# Patient Record
Sex: Female | Born: 1940 | Race: White | Hispanic: No | Marital: Married | Smoking: Never smoker
Health system: Southern US, Community
[De-identification: ages and names within clinical notes are randomized; demographics above are authoritative.]

## PROBLEM LIST (undated history)

## (undated) DIAGNOSIS — C801 Malignant (primary) neoplasm, unspecified: Secondary | ICD-10-CM

## (undated) HISTORY — PX: PARTIAL HYSTERECTOMY: SHX80

## (undated) HISTORY — PX: BREAST BIOPSY: SHX20

---

## 2004-05-25 ENCOUNTER — Ambulatory Visit: Payer: Self-pay | Admitting: Internal Medicine

## 2005-07-10 ENCOUNTER — Ambulatory Visit: Payer: Self-pay | Admitting: Internal Medicine

## 2006-01-03 ENCOUNTER — Ambulatory Visit: Payer: Self-pay

## 2006-04-12 ENCOUNTER — Emergency Department: Payer: Self-pay | Admitting: Emergency Medicine

## 2006-04-13 ENCOUNTER — Other Ambulatory Visit: Payer: Self-pay

## 2006-08-27 ENCOUNTER — Ambulatory Visit: Payer: Self-pay | Admitting: Internal Medicine

## 2007-09-08 ENCOUNTER — Ambulatory Visit: Payer: Self-pay | Admitting: Internal Medicine

## 2008-11-02 ENCOUNTER — Ambulatory Visit: Payer: Self-pay | Admitting: Internal Medicine

## 2008-12-02 ENCOUNTER — Ambulatory Visit: Payer: Self-pay | Admitting: Rheumatology

## 2009-11-09 ENCOUNTER — Ambulatory Visit: Payer: Self-pay | Admitting: Internal Medicine

## 2010-11-29 ENCOUNTER — Ambulatory Visit: Payer: Self-pay | Admitting: Internal Medicine

## 2011-04-23 ENCOUNTER — Ambulatory Visit: Payer: Self-pay | Admitting: Gastroenterology

## 2011-04-25 LAB — PATHOLOGY REPORT

## 2011-12-04 ENCOUNTER — Ambulatory Visit: Payer: Self-pay | Admitting: Internal Medicine

## 2012-06-18 ENCOUNTER — Ambulatory Visit: Payer: Self-pay | Admitting: Internal Medicine

## 2012-12-10 ENCOUNTER — Ambulatory Visit: Payer: Self-pay | Admitting: Internal Medicine

## 2013-02-18 ENCOUNTER — Ambulatory Visit: Payer: Self-pay | Admitting: Internal Medicine

## 2013-04-07 ENCOUNTER — Ambulatory Visit: Payer: Self-pay | Admitting: Internal Medicine

## 2013-04-10 ENCOUNTER — Ambulatory Visit: Payer: Self-pay | Admitting: Internal Medicine

## 2013-10-23 ENCOUNTER — Ambulatory Visit: Payer: Self-pay | Admitting: Internal Medicine

## 2013-12-01 ENCOUNTER — Emergency Department: Payer: Self-pay | Admitting: Internal Medicine

## 2013-12-01 LAB — COMPREHENSIVE METABOLIC PANEL
Albumin: 2.9 g/dL — ABNORMAL LOW (ref 3.4–5.0)
Alkaline Phosphatase: 44 U/L — ABNORMAL LOW
Anion Gap: 9 (ref 7–16)
BUN: 23 mg/dL — ABNORMAL HIGH (ref 7–18)
Bilirubin,Total: 0.4 mg/dL (ref 0.2–1.0)
Calcium, Total: 8.3 mg/dL — ABNORMAL LOW (ref 8.5–10.1)
Chloride: 103 mmol/L (ref 98–107)
Co2: 24 mmol/L (ref 21–32)
Creatinine: 1.3 mg/dL (ref 0.60–1.30)
EGFR (African American): 48 — ABNORMAL LOW
EGFR (Non-African Amer.): 41 — ABNORMAL LOW
Glucose: 181 mg/dL — ABNORMAL HIGH (ref 65–99)
Osmolality: 280 (ref 275–301)
Potassium: 4.2 mmol/L (ref 3.5–5.1)
SGOT(AST): 20 U/L (ref 15–37)
SGPT (ALT): 14 U/L
Sodium: 136 mmol/L (ref 136–145)
Total Protein: 6.5 g/dL (ref 6.4–8.2)

## 2013-12-01 LAB — CBC
HCT: 41.5 % (ref 35.0–47.0)
HGB: 13.6 g/dL (ref 12.0–16.0)
MCH: 30 pg (ref 26.0–34.0)
MCHC: 32.9 g/dL (ref 32.0–36.0)
MCV: 91 fL (ref 80–100)
Platelet: 342 10*3/uL (ref 150–440)
RBC: 4.55 10*6/uL (ref 3.80–5.20)
RDW: 13.9 % (ref 11.5–14.5)
WBC: 15.1 10*3/uL — ABNORMAL HIGH (ref 3.6–11.0)

## 2013-12-01 LAB — TROPONIN I: Troponin-I: <0.02 ng/mL

## 2014-05-06 ENCOUNTER — Ambulatory Visit: Payer: Self-pay | Admitting: Unknown Physician Specialty

## 2014-11-29 ENCOUNTER — Other Ambulatory Visit: Payer: Self-pay | Admitting: Internal Medicine

## 2014-11-29 DIAGNOSIS — Z1231 Encounter for screening mammogram for malignant neoplasm of breast: Secondary | ICD-10-CM

## 2014-12-09 ENCOUNTER — Other Ambulatory Visit: Payer: Self-pay | Admitting: Internal Medicine

## 2014-12-09 ENCOUNTER — Ambulatory Visit
Admission: RE | Admit: 2014-12-09 | Discharge: 2014-12-09 | Disposition: A | Discharge status: Home / Self Care | Payer: Medicare Other | Source: Ambulatory Visit | Attending: Internal Medicine | Admitting: Internal Medicine

## 2014-12-09 DIAGNOSIS — Z1231 Encounter for screening mammogram for malignant neoplasm of breast: Secondary | ICD-10-CM | POA: Insufficient documentation

## 2014-12-09 HISTORY — DX: Malignant (primary) neoplasm, unspecified: C80.1

## 2014-12-13 ENCOUNTER — Other Ambulatory Visit: Payer: Self-pay | Admitting: Internal Medicine

## 2014-12-13 DIAGNOSIS — R928 Other abnormal and inconclusive findings on diagnostic imaging of breast: Secondary | ICD-10-CM

## 2014-12-13 DIAGNOSIS — N63 Unspecified lump in unspecified breast: Secondary | ICD-10-CM

## 2014-12-23 ENCOUNTER — Ambulatory Visit
Admission: RE | Admit: 2014-12-23 | Discharge: 2014-12-23 | Disposition: A | Discharge status: Home / Self Care | Payer: Medicare Other | Source: Ambulatory Visit | Attending: Internal Medicine | Admitting: Internal Medicine

## 2014-12-23 DIAGNOSIS — R928 Other abnormal and inconclusive findings on diagnostic imaging of breast: Secondary | ICD-10-CM

## 2014-12-23 DIAGNOSIS — R921 Mammographic calcification found on diagnostic imaging of breast: Secondary | ICD-10-CM | POA: Insufficient documentation

## 2014-12-23 DIAGNOSIS — N63 Unspecified lump in unspecified breast: Secondary | ICD-10-CM

## 2015-08-25 ENCOUNTER — Other Ambulatory Visit: Payer: Self-pay | Admitting: Orthopedic Surgery

## 2015-08-25 DIAGNOSIS — M5416 Radiculopathy, lumbar region: Secondary | ICD-10-CM

## 2015-08-25 DIAGNOSIS — M4726 Other spondylosis with radiculopathy, lumbar region: Secondary | ICD-10-CM

## 2015-09-20 ENCOUNTER — Ambulatory Visit
Admission: RE | Admit: 2015-09-20 | Discharge: 2015-09-20 | Disposition: A | Discharge status: Home / Self Care | Payer: Medicare Other | Source: Ambulatory Visit | Attending: Orthopedic Surgery | Admitting: Orthopedic Surgery

## 2015-09-20 ENCOUNTER — Ambulatory Visit: Admission: RE | Admit: 2015-09-20 | Payer: Medicare Other | Source: Ambulatory Visit

## 2015-09-20 DIAGNOSIS — M2578 Osteophyte, vertebrae: Secondary | ICD-10-CM | POA: Diagnosis not present

## 2015-09-20 DIAGNOSIS — M4726 Other spondylosis with radiculopathy, lumbar region: Secondary | ICD-10-CM | POA: Diagnosis present

## 2015-09-20 DIAGNOSIS — M5416 Radiculopathy, lumbar region: Secondary | ICD-10-CM | POA: Diagnosis present

## 2015-09-20 DIAGNOSIS — N9489 Other specified conditions associated with female genital organs and menstrual cycle: Secondary | ICD-10-CM | POA: Insufficient documentation

## 2015-09-20 DIAGNOSIS — M4186 Other forms of scoliosis, lumbar region: Secondary | ICD-10-CM | POA: Insufficient documentation

## 2015-09-20 DIAGNOSIS — M4806 Spinal stenosis, lumbar region: Secondary | ICD-10-CM | POA: Insufficient documentation

## 2015-11-30 ENCOUNTER — Other Ambulatory Visit: Payer: Self-pay | Admitting: Internal Medicine

## 2015-11-30 DIAGNOSIS — Z1231 Encounter for screening mammogram for malignant neoplasm of breast: Secondary | ICD-10-CM

## 2015-12-12 ENCOUNTER — Ambulatory Visit
Admission: RE | Admit: 2015-12-12 | Discharge: 2015-12-12 | Disposition: A | Discharge status: Home / Self Care | Payer: Medicare Other | Source: Ambulatory Visit | Attending: Internal Medicine | Admitting: Internal Medicine

## 2015-12-12 ENCOUNTER — Other Ambulatory Visit: Payer: Self-pay | Admitting: Internal Medicine

## 2015-12-12 DIAGNOSIS — Z1231 Encounter for screening mammogram for malignant neoplasm of breast: Secondary | ICD-10-CM | POA: Diagnosis present

## 2016-12-13 ENCOUNTER — Other Ambulatory Visit: Payer: Self-pay | Admitting: Internal Medicine

## 2016-12-13 DIAGNOSIS — Z1231 Encounter for screening mammogram for malignant neoplasm of breast: Secondary | ICD-10-CM

## 2016-12-18 ENCOUNTER — Ambulatory Visit
Admission: RE | Admit: 2016-12-18 | Discharge: 2016-12-18 | Disposition: A | Discharge status: Home / Self Care | Payer: Medicare Other | Source: Ambulatory Visit | Attending: Internal Medicine | Admitting: Internal Medicine

## 2016-12-18 DIAGNOSIS — Z1231 Encounter for screening mammogram for malignant neoplasm of breast: Secondary | ICD-10-CM | POA: Diagnosis not present

## 2018-02-24 ENCOUNTER — Ambulatory Visit
Admission: RE | Admit: 2018-02-24 | Discharge: 2018-02-24 | Disposition: A | Discharge status: Home / Self Care | Payer: Medicare Other | Source: Ambulatory Visit | Attending: Internal Medicine | Admitting: Internal Medicine

## 2018-02-24 ENCOUNTER — Other Ambulatory Visit: Payer: Self-pay | Admitting: Internal Medicine

## 2018-02-24 DIAGNOSIS — Z1231 Encounter for screening mammogram for malignant neoplasm of breast: Secondary | ICD-10-CM | POA: Diagnosis present

## 2018-03-31 ENCOUNTER — Ambulatory Visit (INDEPENDENT_AMBULATORY_CARE_PROVIDER_SITE_OTHER): Payer: Medicare Other | Admitting: Obstetrics and Gynecology

## 2018-03-31 ENCOUNTER — Encounter: Payer: Self-pay | Admitting: Obstetrics and Gynecology

## 2018-03-31 VITALS — BP 148/65 | HR 71 | Ht 61.0 in | Wt 143.8 lb

## 2018-03-31 DIAGNOSIS — Z7689 Persons encountering health services in other specified circumstances: Secondary | ICD-10-CM

## 2018-03-31 DIAGNOSIS — N3001 Acute cystitis with hematuria: Secondary | ICD-10-CM | POA: Diagnosis not present

## 2018-03-31 DIAGNOSIS — N8110 Cystocele, unspecified: Secondary | ICD-10-CM | POA: Diagnosis not present

## 2018-03-31 DIAGNOSIS — N952 Postmenopausal atrophic vaginitis: Secondary | ICD-10-CM

## 2018-03-31 NOTE — Progress Notes (Signed)
GYNECOLOGY CLINIC PROGRESS NOTE Subjective:     Kayla Mckee is a 77 y.o. G33P2002 female here to re-establish care.  She notes she was last seen by a GYN ([facility]) in 2016 for f/u of a persistent benign ovarian cyst.    Current complaints include:  1. Urinary incontinence - has been ongoing for at least the past 3-4 years.  Notes that it occurs mostly with position changes, stepping up on stairs, etc.  Denies any urgency component.  2. UTI with hematuria - Patient reports that she has had 2 UTI's in the past several weeks.  Her last UTI was diagnosed yesterday when she was seen in urgent care.  She was noting spotting with wiping. She was given a prescription for Ceftin which she has initiated.  She was also diagnosed with a yeast infection, and given a prescription for Diflucan.  Of note, patient notes that she had heavier bleeding last night, and was not sure if it was from the urinary tract or vaginal (although she thinks it was urinary).  She does have a remote history of hysterectomy.    Gynecologic History No LMP recorded. Patient has had a hysterectomy. Last Pap: "many years ago". Results were: normal Last mammogram: 01/2018. Results were: normal  Obstetric History OB History  Gravida Para Term Preterm AB Living  2 2 2     2   SAB TAB Ectopic Multiple Live Births          2    # Outcome Date GA Lbr Len/2nd Weight Sex Delivery Anes PTL Lv  2 Term 1971    F Vag-Spont   LIV  1 Term 1965    F Vag-Spont   LIV     Past Medical History:  Diagnosis Date  . Cancer ([place])    skin    Family History  Problem Relation Age of Onset  . Heart attack Mother   . Cancer - Prostate Father   . Breast cancer Neg Hx     Past Surgical History:  Procedure Laterality Date  . BREAST BIOPSY Right    negative 2003  . PARTIAL HYSTERECTOMY     with left oophorectomy    Social History   Socioeconomic History  . Marital status: Married    Spouse name: Not on file  . Number of  children: Not on file  . Years of education: Not on file  . Highest education level: Not on file  Occupational History  . Not on file  Social Needs  . Financial resource strain: Not on file  . Food insecurity:    Worry: Not on file    Inability: Not on file  . Transportation needs:    Medical: Not on file    Non-medical: Not on file  Tobacco Use  . Smoking status: Never Smoker  . Smokeless tobacco: Never Used  Substance and Sexual Activity  . Alcohol use: Never    Frequency: Never  . Drug use: Never  . Sexual activity: Not Currently  Lifestyle  . Physical activity:    Days per week: Not on file    Minutes per session: Not on file  . Stress: Not on file  Relationships  . Social connections:    Talks on phone: Not on file    Gets together: Not on file    Attends religious service: Not on file    Active member of club or organization: Not on file    Attends meetings of clubs or organizations:  Not on file    Relationship status: Not on file  . Intimate partner violence:    Fear of current or ex partner: Not on file    Emotionally abused: Not on file    Physically abused: Not on file    Forced sexual activity: Not on file  Other Topics Concern  . Not on file  Social History Narrative  . Not on file    Current Outpatient Medications on File Prior to Visit  Medication Sig Dispense Refill  . amLODipine (NORVASC) 5 MG tablet Take 5 mg by mouth daily.    . Calcium Carbonate-Simethicone 1000-60 MG CHEW Chew by mouth.    . cefUROXime (CEFTIN) 500 MG tablet Take 500 mg by mouth 2 (two) times daily with a meal.    . cloNIDine (CATAPRES) 0.1 MG tablet Take 0.1 mg by mouth 2 (two) times daily.    Marland Kitchen estradiol (ESTRACE) 2 MG tablet Take 2 mg by mouth daily.    . magnesium oxide (MAG-OX) 400 MG tablet Take 400 mg by mouth 2 (two) times daily.    . metoprolol tartrate (LOPRESSOR) 50 MG tablet Take 50 mg by mouth 2 (two) times daily.    Marland Kitchen omeprazole (PRILOSEC) 40 MG capsule Take 40  mg by mouth daily.    . pravastatin (PRAVACHOL) 40 MG tablet Take 40 mg by mouth daily.    . predniSONE (DELTASONE) 10 MG tablet Take 10 mg by mouth daily with breakfast. 4 tablets po daily x 3 days, 3 tabs po daily x 3 days, 2 tabs po daily x 3 days, 1 tab po daily x 3 days.    Marland Kitchen triamterene-hydrochlorothiazide (MAXZIDE-25) 37.5-25 MG tablet Take 1 tablet by mouth daily.     No current facility-administered medications on file prior to visit.     Allergies  Allergen Reactions  . Codeine      Review of Systems Pertinent items noted in HPI and remainder of comprehensive ROS otherwise negative.    Objective:    BP (!) 148/65   Pulse 71   Ht 5\' 1"  (1.549 m)   Wt 143 lb 12.8 oz (65.2 kg)   BMI 27.17 kg/m  General appearance: alert and no distress Neck: no adenopathy, no carotid bruit, no JVD, supple, symmetrical, trachea midline and thyroid not enlarged, symmetric, no tenderness/mass/nodules Lungs: clear to auscultation bilaterally Breasts: exam deferred by patient as she notes having a recent negative mammogram Heart: regular rate and rhythm, S1, S2 normal, no murmur, click, rub or gallop Abdomen: soft, non-tender; bowel sounds normal; no masses,  no organomegaly Pelvic: external genitalia normal, rectovaginal septum normal.  Vagina without discharge or blood. 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  Uterus and cervix surgically absent. Right adnexae non-palpable, nontender.  Extremities: extremities normal, atraumatic, no cyanosis or edema Neurologic: Grossly normal     Assessment:   Establish care The patient has a cystocele and rectocele   UTI with hematuria Vaginal atrophy (mild)  Plan:    Discussed cystoceles/rectoceles and management options with the patient. All questions answered. Neurosurgeon distributed. Discussed pessary and will plan visit for fitting if desired when patient  is ready. Discussed surgical repair. Paitent unsure at this time Patient to call back for f/u appointment once decision is made regarding next step in management.  All other health care maintenance up to date, sees PCP routinely, as well as her Dermatologist.  Patient currently on estrogen therapy (  oral) which should help to maintain atrophy treatment.  To continue with antibiotic treatment for UTI. If hematuria persists beyond treatment, patient will need f/u with Urology.   Rubie Maid, MD [facility]

## 2018-03-31 NOTE — Progress Notes (Signed)
   PT is present today for her annual exam. Pt stated that she is doing well and denies any issues. Pt stated that she has been leaking urine and she wearing pads to help with incontinence.

## 2018-04-01 ENCOUNTER — Encounter: Payer: Self-pay | Admitting: Obstetrics and Gynecology

## 2018-11-21 ENCOUNTER — Other Ambulatory Visit
Admission: RE | Admit: 2018-11-21 | Discharge: 2018-11-21 | Disposition: A | Discharge status: Home / Self Care | Payer: Medicare Other | Source: Ambulatory Visit | Attending: Cardiology | Admitting: Cardiology

## 2018-11-21 ENCOUNTER — Other Ambulatory Visit: Payer: Self-pay

## 2018-11-21 DIAGNOSIS — Z01812 Encounter for preprocedural laboratory examination: Secondary | ICD-10-CM | POA: Diagnosis present

## 2018-11-21 DIAGNOSIS — Z20828 Contact with and (suspected) exposure to other viral communicable diseases: Secondary | ICD-10-CM | POA: Insufficient documentation

## 2018-11-22 LAB — SARS CORONAVIRUS 2 (TAT 6-24 HRS): SARS Coronavirus 2: NEGATIVE

## 2018-11-26 ENCOUNTER — Ambulatory Visit
Admission: RE | Admit: 2018-11-26 | Discharge: 2018-11-26 | Disposition: A | Discharge status: Home / Self Care | Payer: Medicare Other | Attending: Cardiology | Admitting: Cardiology

## 2018-11-26 ENCOUNTER — Encounter
Admission: RE | Disposition: A | Discharge status: Home / Self Care | Payer: Self-pay | Source: Home / Self Care | Attending: Cardiology

## 2018-11-26 ENCOUNTER — Other Ambulatory Visit: Payer: Self-pay

## 2018-11-26 ENCOUNTER — Encounter: Payer: Self-pay | Admitting: *Deleted

## 2018-11-26 DIAGNOSIS — E119 Type 2 diabetes mellitus without complications: Secondary | ICD-10-CM | POA: Insufficient documentation

## 2018-11-26 DIAGNOSIS — E785 Hyperlipidemia, unspecified: Secondary | ICD-10-CM | POA: Diagnosis not present

## 2018-11-26 DIAGNOSIS — R9430 Abnormal result of cardiovascular function study, unspecified: Secondary | ICD-10-CM | POA: Diagnosis present

## 2018-11-26 DIAGNOSIS — Z823 Family history of stroke: Secondary | ICD-10-CM | POA: Insufficient documentation

## 2018-11-26 DIAGNOSIS — M810 Age-related osteoporosis without current pathological fracture: Secondary | ICD-10-CM | POA: Diagnosis not present

## 2018-11-26 DIAGNOSIS — R5383 Other fatigue: Secondary | ICD-10-CM | POA: Diagnosis not present

## 2018-11-26 DIAGNOSIS — I10 Essential (primary) hypertension: Secondary | ICD-10-CM | POA: Diagnosis not present

## 2018-11-26 DIAGNOSIS — M1990 Unspecified osteoarthritis, unspecified site: Secondary | ICD-10-CM | POA: Insufficient documentation

## 2018-11-26 DIAGNOSIS — Z90710 Acquired absence of both cervix and uterus: Secondary | ICD-10-CM | POA: Diagnosis not present

## 2018-11-26 DIAGNOSIS — Z79899 Other long term (current) drug therapy: Secondary | ICD-10-CM | POA: Diagnosis not present

## 2018-11-26 DIAGNOSIS — R079 Chest pain, unspecified: Secondary | ICD-10-CM | POA: Insufficient documentation

## 2018-11-26 DIAGNOSIS — Z8249 Family history of ischemic heart disease and other diseases of the circulatory system: Secondary | ICD-10-CM | POA: Diagnosis not present

## 2018-11-26 DIAGNOSIS — Z885 Allergy status to narcotic agent status: Secondary | ICD-10-CM | POA: Insufficient documentation

## 2018-11-26 DIAGNOSIS — K219 Gastro-esophageal reflux disease without esophagitis: Secondary | ICD-10-CM | POA: Insufficient documentation

## 2018-11-26 DIAGNOSIS — H25019 Cortical age-related cataract, unspecified eye: Secondary | ICD-10-CM | POA: Insufficient documentation

## 2018-11-26 HISTORY — PX: LEFT HEART CATH AND CORONARY ANGIOGRAPHY: CATH118249

## 2018-11-26 SURGERY — LEFT HEART CATH AND CORONARY ANGIOGRAPHY
Anesthesia: Moderate Sedation | Laterality: Left

## 2018-11-26 MED ORDER — HEPARIN SODIUM (PORCINE) 1000 UNIT/ML IJ SOLN
INTRAMUSCULAR | Status: DC | PRN
  Administered 2018-11-26: 3000 [IU] via INTRAVENOUS

## 2018-11-26 MED ORDER — SODIUM CHLORIDE 0.9% FLUSH: 3.0000 mL | Freq: Two times a day (BID) | INTRAVENOUS | Status: DC

## 2018-11-26 MED ORDER — ACETAMINOPHEN 325 MG PO TABS: 650.0000 mg | ORAL_TABLET | ORAL | Status: DC | PRN

## 2018-11-26 MED ORDER — IOHEXOL 300 MG/ML  SOLN
INTRAMUSCULAR | Status: DC | PRN
  Administered 2018-11-26: 95 mL via INTRA_ARTERIAL

## 2018-11-26 MED ORDER — HEPARIN SODIUM (PORCINE) 1000 UNIT/ML IJ SOLN
INTRAMUSCULAR | Status: AC
  Filled 2018-11-26: qty 1

## 2018-11-26 MED ORDER — SODIUM CHLORIDE 0.9 % WEIGHT BASED INFUSION: 1.0000 mL/kg/h | INTRAVENOUS | Status: DC

## 2018-11-26 MED ORDER — LABETALOL HCL 5 MG/ML IV SOLN: 10.0000 mg | INTRAVENOUS | Status: DC | PRN

## 2018-11-26 MED ORDER — SODIUM CHLORIDE 0.9% FLUSH: 3.0000 mL | INTRAVENOUS | Status: DC | PRN

## 2018-11-26 MED ORDER — ASPIRIN 81 MG PO CHEW
81.0000 mg | CHEWABLE_TABLET | ORAL | Status: AC
  Administered 2018-11-26: 07:00:00 81 mg via ORAL

## 2018-11-26 MED ORDER — VERAPAMIL HCL 2.5 MG/ML IV SOLN
INTRAVENOUS | Status: DC | PRN
  Administered 2018-11-26: 2.5 mg via INTRA_ARTERIAL

## 2018-11-26 MED ORDER — MIDAZOLAM HCL 2 MG/2ML IJ SOLN
INTRAMUSCULAR | Status: DC | PRN
  Administered 2018-11-26: 1 mg via INTRAVENOUS

## 2018-11-26 MED ORDER — SODIUM CHLORIDE 0.9 % WEIGHT BASED INFUSION
3.0000 mL/kg/h | INTRAVENOUS | Status: AC
  Administered 2018-11-26: 07:00:00 3 mL/kg/h via INTRAVENOUS

## 2018-11-26 MED ORDER — MIDAZOLAM HCL 2 MG/2ML IJ SOLN
INTRAMUSCULAR | Status: AC
  Filled 2018-11-26: qty 2

## 2018-11-26 MED ORDER — ONDANSETRON HCL 4 MG/2ML IJ SOLN: 4.0000 mg | Freq: Four times a day (QID) | INTRAMUSCULAR | Status: DC | PRN

## 2018-11-26 MED ORDER — HEPARIN (PORCINE) IN NACL 1000-0.9 UT/500ML-% IV SOLN
INTRAVENOUS | Status: AC
  Filled 2018-11-26: qty 1000

## 2018-11-26 MED ORDER — FENTANYL CITRATE (PF) 100 MCG/2ML IJ SOLN
INTRAMUSCULAR | Status: AC
  Filled 2018-11-26: qty 2

## 2018-11-26 MED ORDER — FENTANYL CITRATE (PF) 100 MCG/2ML IJ SOLN
INTRAMUSCULAR | Status: DC | PRN
  Administered 2018-11-26: 25 ug via INTRAVENOUS

## 2018-11-26 MED ORDER — VERAPAMIL HCL 2.5 MG/ML IV SOLN
INTRAVENOUS | Status: AC
  Filled 2018-11-26: qty 2

## 2018-11-26 MED ORDER — ASPIRIN 81 MG PO CHEW
CHEWABLE_TABLET | ORAL | Status: AC
  Filled 2018-11-26: qty 1

## 2018-11-26 MED ORDER — HYDRALAZINE HCL 20 MG/ML IJ SOLN: 10.0000 mg | INTRAMUSCULAR | Status: DC | PRN

## 2018-11-26 MED ORDER — HEPARIN (PORCINE) IN NACL 1000-0.9 UT/500ML-% IV SOLN
INTRAVENOUS | Status: DC | PRN
  Administered 2018-11-26: 500 mL

## 2018-11-26 MED ORDER — SODIUM CHLORIDE 0.9 % IV SOLN: 250.0000 mL | INTRAVENOUS | Status: DC | PRN

## 2018-11-26 SURGICAL SUPPLY — 10 items
CATH 5F 110X4 TIG (CATHETERS) ×3 IMPLANT
CATH INFINITI 5 FR JL3.5 (CATHETERS) ×3 IMPLANT
CATH INFINITI 5FR ANG PIGTAIL (CATHETERS) ×3 IMPLANT
CATH INFINITI JR4 5F (CATHETERS) ×3 IMPLANT
DEVICE RAD TR BAND REGULAR (VASCULAR PRODUCTS) ×3 IMPLANT
GLIDESHEATH SLEND SS 6F .021 (SHEATH) ×3 IMPLANT
KIT MANI 3VAL PERCEP (MISCELLANEOUS) ×3 IMPLANT
PACK CARDIAC CATH (CUSTOM PROCEDURE TRAY) ×3 IMPLANT
WIRE HITORQ VERSACORE ST 145CM (WIRE) ×3 IMPLANT
WIRE ROSEN-J .035X260CM (WIRE) ×3 IMPLANT

## 2018-11-26 NOTE — Discharge Instructions (Signed)
Radial Site Care Refer to this sheet in the next few weeks. These instructions provide you with information about caring for yourself after your procedure. Your health care provider may also give you more specific instructions. Your treatment has been planned according to current medical practices, but problems sometimes occur. Call your health care provider if you have any problems or questions after your procedure. What can I expect after the procedure? After your procedure, it is typical to have the following: Bruising at the radial site that usually fades within 1-2 weeks. Blood collecting in the tissue (hematoma) that may be painful to the touch. It should usually decrease in size and tenderness within 1-2 weeks.  Follow these instructions at home: Take medicines only as directed by your health care provider. If you are on a medication called Metformin please do not take for 48 hours after your procedure. Over the next 48hrs please increase your fluid intake of water and non caffeine beverages to flush the contrast dye out of your system.  You may shower 24 hours after the procedure  Leave your bandage on and gently wash the site with plain soap and water. Pat the area dry with a clean towel. Do not rub the site, because this may cause bleeding.  Remove your dressing 48hrs after your procedure and leave open to air.  Do not submerge your site in water for 7 days. This includes swimming and washing dishes.  Check your insertion site every day for redness, swelling, or drainage. Do not apply powder or lotion to the site. Do not flex or bend the affected arm for 24 hours or as directed by your health care provider. Do not push or pull heavy objects with the affected arm for 24 hours or as directed by your health care provider. Do not lift over 10 lb (4.5 kg) for 5 days after your procedure or as directed by your health care provider. Ask your health care provider when it is okay to: Return to  work or school. Resume usual physical activities or sports. Resume sexual activity. Do not drive home if you are discharged the same day as the procedure. Have someone else drive you. You may drive 48 hours after the procedure Do not operate machinery or power tools for 24 hours after the procedure. If your procedure was done as an outpatient procedure, which means that you went home the same day as your procedure, a responsible adult should be with you for the first 24 hours after you arrive home. Keep all follow-up visits as directed by your health care provider. This is important. Contact a health care provider if: You have a fever. You have chills. You have increased bleeding from the radial site. Hold pressure on the site. Get help right away if: You have unusual pain at the radial site. You have redness, warmth, or swelling at the radial site. You have drainage (other than a small amount of blood on the dressing) from the radial site. The radial site is bleeding, and the bleeding does not stop after 15 minutes of holding steady pressure on the site. Your arm or hand becomes pale, cool, tingly, or numb. This information is not intended to replace advice given to you by your health care provider. Make sure you discuss any questions you have with your health care provider. Document Released: 04/21/2010 Document Revised: 08/25/2015 Document Reviewed: 10/05/2013 Elsevier Interactive Patient Education  2018 [employer]  

## 2019-04-08 ENCOUNTER — Other Ambulatory Visit: Payer: Self-pay | Admitting: Internal Medicine

## 2019-04-08 DIAGNOSIS — Z1231 Encounter for screening mammogram for malignant neoplasm of breast: Secondary | ICD-10-CM

## 2019-04-09 ENCOUNTER — Ambulatory Visit
Admission: RE | Admit: 2019-04-09 | Discharge: 2019-04-09 | Disposition: A | Discharge status: Home / Self Care | Payer: Medicare Other | Source: Ambulatory Visit | Attending: Internal Medicine | Admitting: Internal Medicine

## 2019-04-09 DIAGNOSIS — Z1231 Encounter for screening mammogram for malignant neoplasm of breast: Secondary | ICD-10-CM | POA: Diagnosis present

## 2019-04-16 ENCOUNTER — Other Ambulatory Visit: Payer: Self-pay | Admitting: Internal Medicine

## 2019-04-16 DIAGNOSIS — N6489 Other specified disorders of breast: Secondary | ICD-10-CM

## 2019-04-16 DIAGNOSIS — R928 Other abnormal and inconclusive findings on diagnostic imaging of breast: Secondary | ICD-10-CM

## 2019-04-22 ENCOUNTER — Ambulatory Visit
Admission: RE | Admit: 2019-04-22 | Discharge: 2019-04-22 | Disposition: A | Discharge status: Home / Self Care | Payer: Medicare Other | Source: Ambulatory Visit | Attending: Internal Medicine | Admitting: Internal Medicine

## 2019-04-22 DIAGNOSIS — N6489 Other specified disorders of breast: Secondary | ICD-10-CM

## 2019-04-22 DIAGNOSIS — R928 Other abnormal and inconclusive findings on diagnostic imaging of breast: Secondary | ICD-10-CM | POA: Diagnosis not present

## 2019-05-31 ENCOUNTER — Emergency Department: Payer: Medicare Other

## 2019-05-31 ENCOUNTER — Emergency Department
Admission: EM | Admit: 2019-05-31 | Discharge: 2019-05-31 | Disposition: A | Discharge status: Home / Self Care | Payer: Medicare Other | Attending: Emergency Medicine | Admitting: Emergency Medicine

## 2019-05-31 ENCOUNTER — Other Ambulatory Visit: Payer: Self-pay

## 2019-05-31 DIAGNOSIS — I10 Essential (primary) hypertension: Secondary | ICD-10-CM | POA: Insufficient documentation

## 2019-05-31 DIAGNOSIS — Z79899 Other long term (current) drug therapy: Secondary | ICD-10-CM | POA: Diagnosis not present

## 2019-05-31 DIAGNOSIS — R079 Chest pain, unspecified: Secondary | ICD-10-CM | POA: Diagnosis present

## 2019-05-31 DIAGNOSIS — M5412 Radiculopathy, cervical region: Secondary | ICD-10-CM | POA: Diagnosis not present

## 2019-05-31 LAB — BASIC METABOLIC PANEL
Anion gap: 11 (ref 5–15)
BUN: 24 mg/dL — ABNORMAL HIGH (ref 8–23)
CO2: 27 mmol/L (ref 22–32)
Calcium: 9.3 mg/dL (ref 8.9–10.3)
Chloride: 102 mmol/L (ref 98–111)
Creatinine, Ser: 1.1 mg/dL — ABNORMAL HIGH (ref 0.44–1.00)
GFR calc Af Amer: 56 mL/min — ABNORMAL LOW (ref >60)
GFR calc non Af Amer: 48 mL/min — ABNORMAL LOW (ref >60)
Glucose, Bld: 161 mg/dL — ABNORMAL HIGH (ref 70–99)
Potassium: 3.9 mmol/L (ref 3.5–5.1)
Sodium: 140 mmol/L (ref 135–145)

## 2019-05-31 LAB — CBC
HCT: 38.5 % (ref 36.0–46.0)
Hemoglobin: 12.5 g/dL (ref 12.0–15.0)
MCH: 29.7 pg (ref 26.0–34.0)
MCHC: 32.5 g/dL (ref 30.0–36.0)
MCV: 91.4 fL (ref 80.0–100.0)
Platelets: 396 10*3/uL (ref 150–400)
RBC: 4.21 MIL/uL (ref 3.87–5.11)
RDW: 12.9 % (ref 11.5–15.5)
WBC: 14.3 10*3/uL — ABNORMAL HIGH (ref 4.0–10.5)
nRBC: 0 % (ref 0.0–0.2)

## 2019-05-31 LAB — TROPONIN I (HIGH SENSITIVITY)
Troponin I (High Sensitivity): 3 ng/L (ref <18)
Troponin I (High Sensitivity): 5 ng/L (ref <18)

## 2019-05-31 IMAGING — CT CT ANGIO HEAD
1 series · 12 of 14 positions shown · IV contrast (APPLIED)
Comparison: 12/01/2013.

CLINICAL DATA: Left arm numbness. Assess for hemorrhage or
dissection. Recent coronavirus vaccination.

EXAM:
CT ANGIOGRAPHY HEAD AND NECK
TECHNIQUE: Multidetector CT imaging of the head and neck was performed using
the standard protocol during bolus administration of intravenous
contrast. Multiplanar CT image reconstructions and MIPs were
obtained to evaluate the vascular anatomy. Carotid stenosis
measurements (when applicable) are obtained utilizing NASCET
criteria, using the distal internal carotid diameter as the
denominator.
CONTRAST:  75mL OMNIPAQUE IOHEXOL 350 MG/ML SOLN

[Series 11: ax thin · axial · 0.32mm/px · z∈[-366,-73]mm · 12 of 358 slices shown]
[im 28/358  soft-tissue]
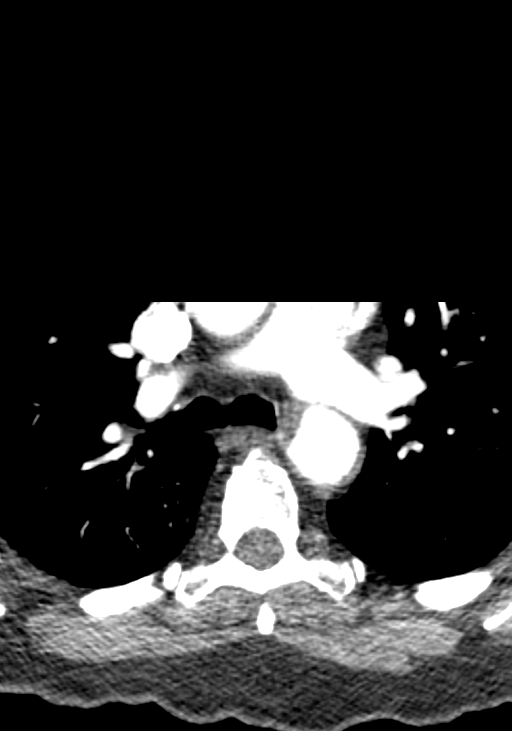
[im 55/358  bone]
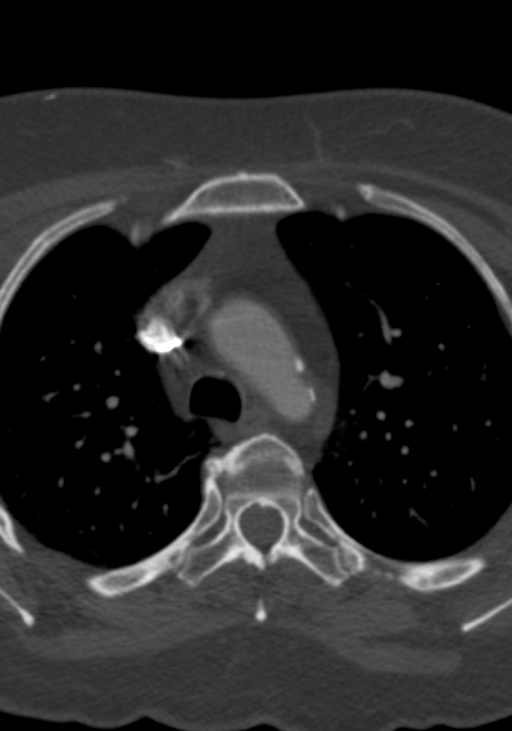
[im 83/358  soft-tissue]
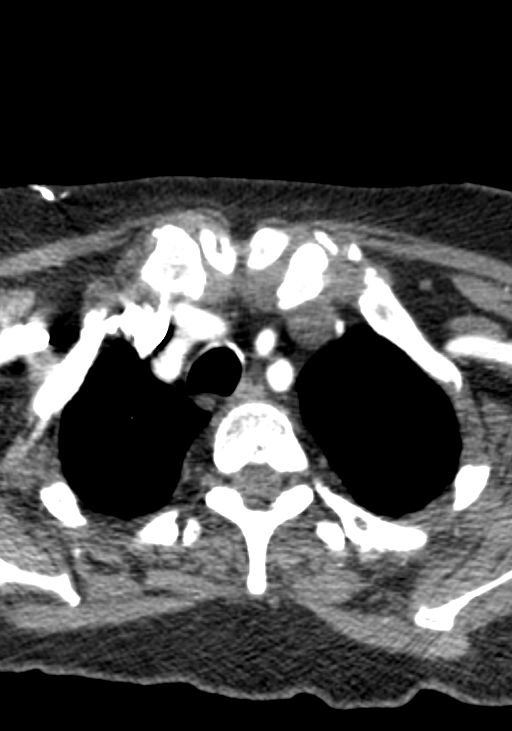
[im 110/358  bone]
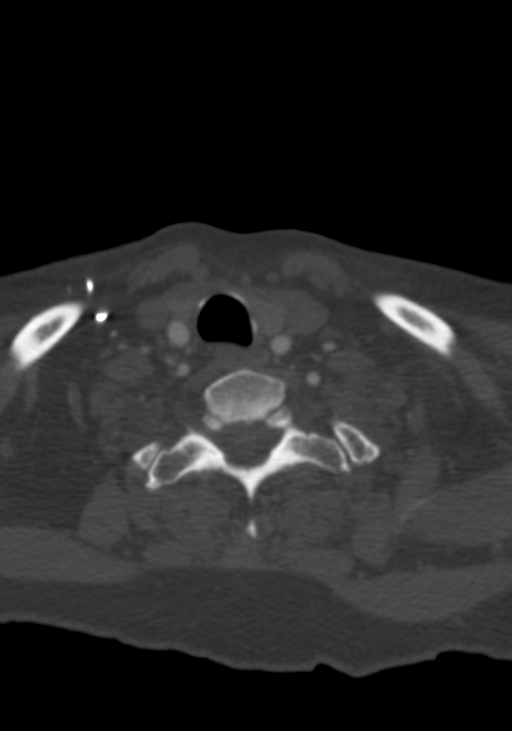
[im 138/358  soft-tissue]
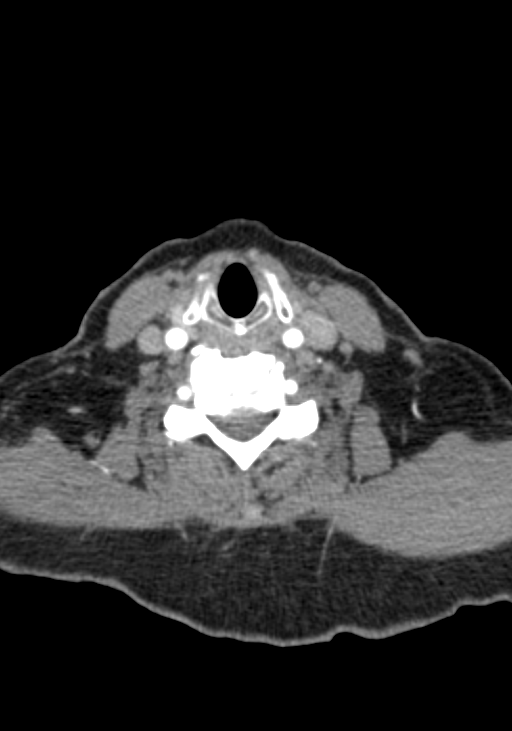
[im 165/358  bone]
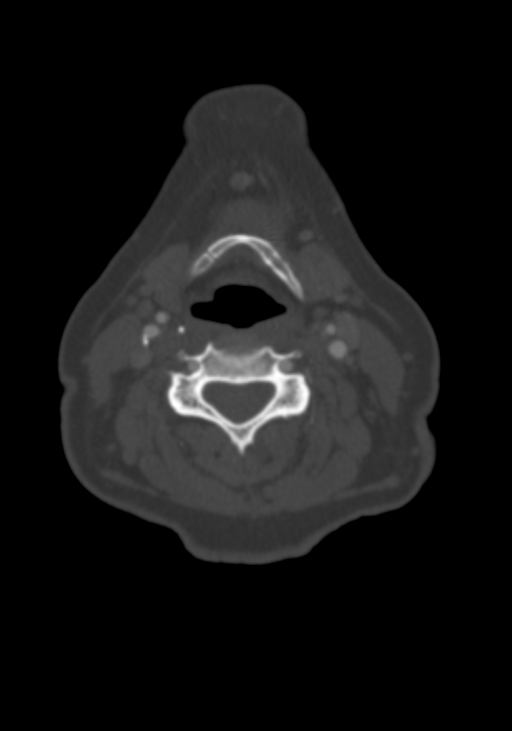
[im 193/358  soft-tissue]
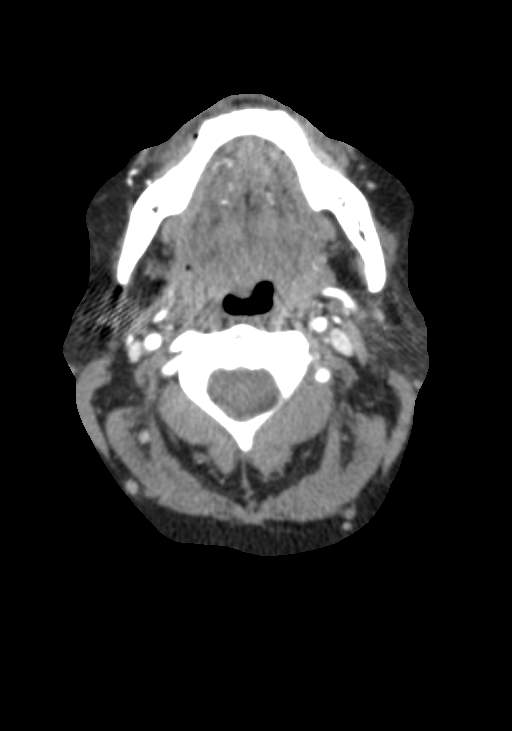
[im 220/358  bone]
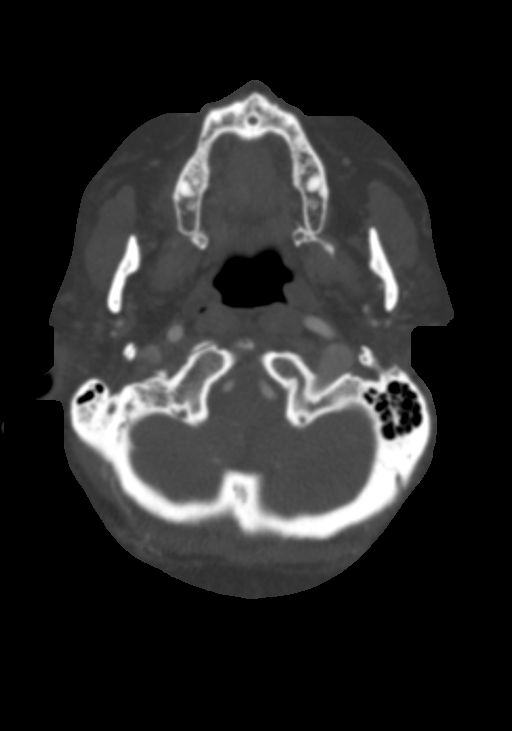
[im 248/358  soft-tissue]
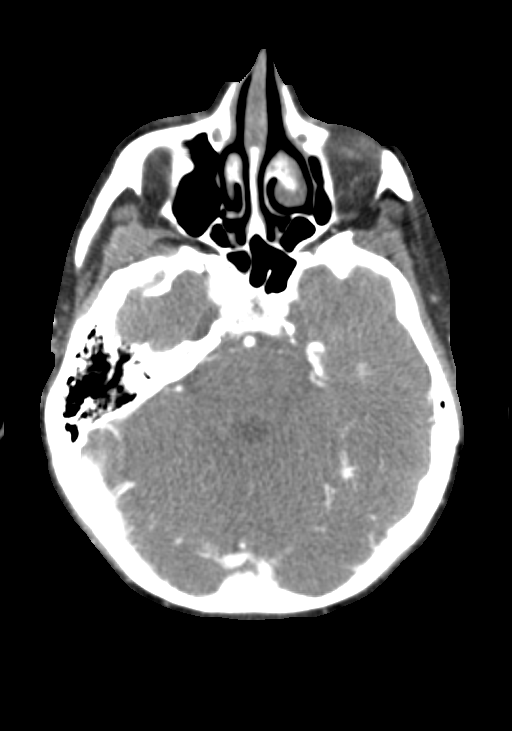
[im 275/358  bone]
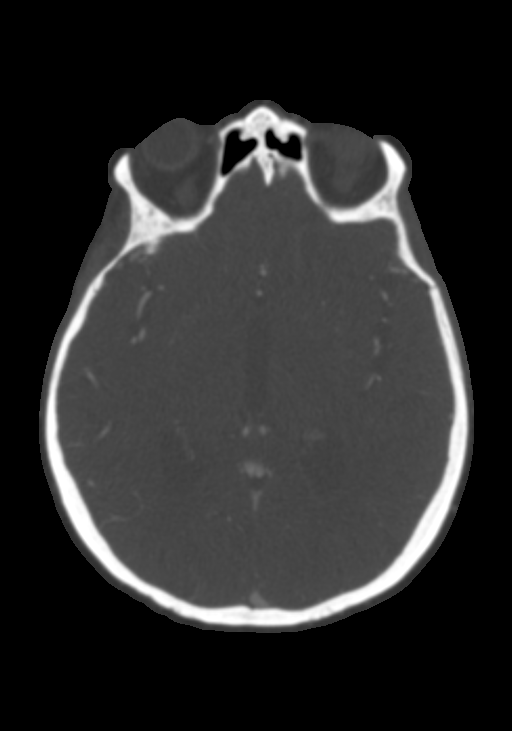
[im 303/358  soft-tissue]
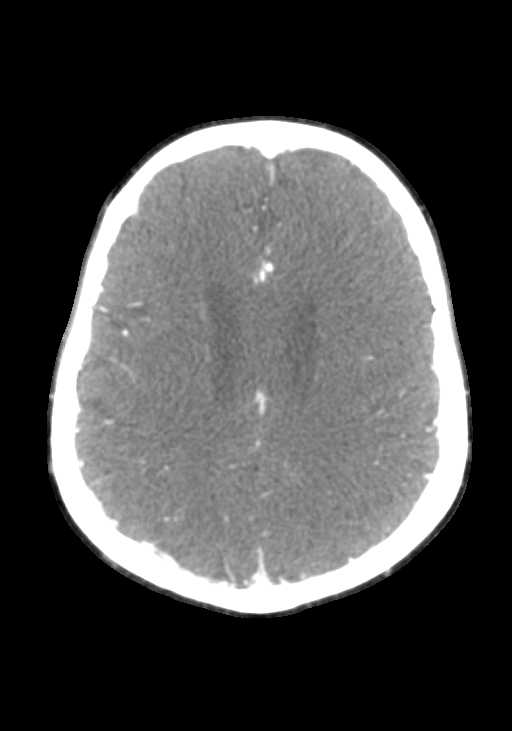
[im 330/358  bone]
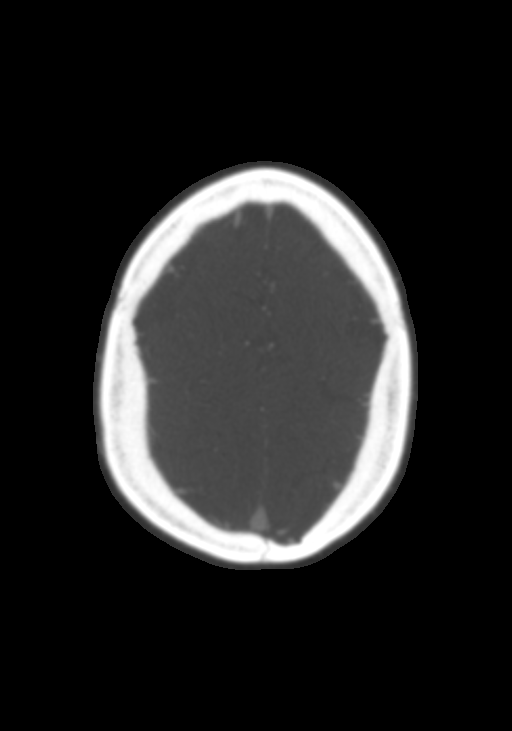

[12 of 14 positions shown; findings below may reference images not displayed]

FINDINGS: CT HEAD FINDINGS

Brain: Mild age related volume loss. No evidence of old or acute
focal infarction, mass lesion, hemorrhage, hydrocephalus or
extra-axial collection.

Vascular: No abnormal vascular finding.

Skull: Negative

Sinuses: Clear

Orbits: Normal

Review of the MIP images confirms the above findings

CTA NECK FINDINGS

Aortic arch: Minimal aortic atherosclerosis. No aneurysm or
dissection. Branching pattern is normal without origin stenosis.

Right carotid system: Common carotid artery widely patent to the
bifurcation. Calcified and soft plaque at the carotid bifurcation
and ICA bulb. Minimal diameter in the ICA bulb measures 2.5 mm.
Compared to a more distal cervical ICA diameter of 4 mm, this
indicates a 40% stenosis.

Left carotid system: Common carotid artery widely patent to the
bifurcation. Carotid bifurcation is normal without atherosclerotic
disease or stenosis. Cervical ICA is normal.

Vertebral arteries: Both vertebral artery origins are widely patent.
Both vertebral arteries appear normal through the cervical region to
the foramen magnum.

Skeleton: Ordinary cervical spondylosis.

Other neck: No mass or lymphadenopathy.

Upper chest: Normal

Review of the MIP images confirms the above findings

CTA HEAD FINDINGS

Anterior circulation: Both internal carotid arteries are patent
through the skull base and siphon regions. No siphon stenosis. The
anterior and middle cerebral vessels are normal without proximal
stenosis, aneurysm or vascular malformation. No large or medium
vessel occlusion.

Posterior circulation: Both vertebral arteries are widely patent to
the basilar. No basilar stenosis. Posterior circulation branch
vessels appear normal.

Venous sinuses: Patent and normal.

Anatomic variants: None significant.

Review of the MIP images confirms the above findings
IMPRESSION: Normal appearance of the brain for age.

No acute or significant vascular pathology. No evidence vascular
dissection.

Atherosclerotic disease at the right carotid bifurcation and ICA
bulb. Maximal stenosis in the ICA bulb measures 40%. No stenosis on
the left.

No posterior circulation pathology.

## 2019-05-31 MED ORDER — TRAMADOL HCL 50 MG PO TABS: 50.0000 mg | ORAL_TABLET | Freq: Four times a day (QID) | ORAL | 0 refills | Status: AC | PRN

## 2019-05-31 MED ORDER — CYCLOBENZAPRINE HCL 5 MG PO TABS: 5.0000 mg | ORAL_TABLET | Freq: Three times a day (TID) | ORAL | 0 refills | Status: DC | PRN

## 2019-05-31 MED ORDER — FENTANYL CITRATE (PF) 100 MCG/2ML IJ SOLN
25.0000 ug | Freq: Once | INTRAMUSCULAR | Status: AC
  Administered 2019-05-31: 25 ug via INTRAVENOUS
  Filled 2019-05-31: qty 2

## 2019-05-31 MED ORDER — IOHEXOL 350 MG/ML SOLN
75.0000 mL | Freq: Once | INTRAVENOUS | Status: AC | PRN
  Administered 2019-05-31: 75 mL via INTRAVENOUS

## 2019-05-31 NOTE — Discharge Instructions (Signed)
Take tramadol for pain   Take flexeril for muscle spasms   Your blood pressure is elevated. Take your BP meds as prescribed   See your doctor tomorrow   Keep a blood pressure log at home   Return to ER if you have worse neck pain, numbness, weakness, chest pain

## 2019-05-31 NOTE — ED Notes (Signed)
Per Dr. Corky Downs, work up for CP.

## 2019-05-31 NOTE — ED Provider Notes (Signed)
[name] EMERGENCY DEPARTMENT Provider Note   CSN: [id number] Arrival date & time: 05/31/19  1620     History Chief Complaint  Patient presents with  . Arm Pain    Kayla Mckee is a 79 y.o. female presenting with left arm pain and numbness.  Patient states that she has been having left arm pain left-sided chest pain for the last 2 to 3 days.  Patient states that the pain is worse when she turns her neck.  She has some subjective numbness in the left arm.  Denies any arm weakness.  She states that today she has some left-sided chest pain as well.  She states that she did have her first dose of her vaccine about 10 days ago on the left arm.  Patient denies any history of strokes in the past.  Patient states that she does take blood pressure medicines and she does have whitecoat hypertension.  The history is provided by the patient.       Past Medical History:  Diagnosis Date  . Cancer ([place])    skin    There are no problems to display for this patient.   Past Surgical History:  Procedure Laterality Date  . BREAST BIOPSY Right    negative 2003  . LEFT HEART CATH AND CORONARY ANGIOGRAPHY Left 11/26/2018   Procedure: LEFT HEART CATH AND CORONARY ANGIOGRAPHY;  Surgeon: Isaias Cowman, MD;  Location: [facility];  Service: Cardiovascular;  Laterality: Left;  . PARTIAL HYSTERECTOMY     with left oophorectomy     OB History    Gravida  2   Para  2   Term  2   Preterm      AB      Living  2     SAB      TAB      Ectopic      Multiple      Live Births  2           Family History  Problem Relation Age of Onset  . Heart attack Mother   . Cancer - Prostate Father   . Breast cancer Neg Hx     Social History   Tobacco Use  . Smoking status: Never Smoker  . Smokeless tobacco: Never Used  Substance Use Topics  . Alcohol use: Never  . Drug use: Never    Home Medications Prior to Admission medications     Medication Sig Start Date End Date Taking? Authorizing Provider  acetaminophen (TYLENOL) 500 MG tablet Take 500-1,000 mg by mouth every 6 (six) hours as needed (for pain.).    [provider]  aluminum-magnesium hydroxide 200-200 MG/5ML suspension Take 15 mLs by mouth every 6 (six) hours as needed for indigestion.    [provider]  amLODipine (NORVASC) 5 MG tablet Take 5 mg by mouth daily.    [provider]  cloNIDine (CATAPRES) 0.1 MG tablet Take 0.1 mg by mouth 2 (two) times daily.    [provider]  estradiol (ESTRACE) 2 MG tablet Take 2 mg by mouth every evening.     [provider]  metoprolol tartrate (LOPRESSOR) 50 MG tablet Take 50 mg by mouth 2 (two) times daily.    [provider]  omeprazole (PRILOSEC) 40 MG capsule Take 40 mg by mouth daily.    [provider]  pravastatin (PRAVACHOL) 40 MG tablet Take 40 mg by mouth every evening.     [provider]  triamterene-hydrochlorothiazide (MAXZIDE-25) 37.5-25 MG tablet Take 1 tablet by mouth daily.    [provider]    Allergies    Codeine  Review of Systems   Review of Systems  Musculoskeletal: Positive for neck pain.  Neurological: Positive for numbness.  All other systems reviewed and are negative.   Physical Exam Updated Vital Signs BP (!) 194/69   Pulse 88   Temp 98.8 F (37.1 C)   Resp 16   Ht 5\' 1"  (1.549 m)   Wt 65.8 kg   SpO2 97%   BMI 27.40 kg/m   Physical Exam Vitals and nursing note reviewed.  Constitutional:      Appearance: Normal appearance.  HENT:     Head: Normocephalic.     Nose: Nose normal.     Mouth/Throat:     Mouth: Mucous membranes are moist.  Eyes:     Extraocular Movements: Extraocular movements intact.     Pupils: Pupils are equal, round, and reactive to light.  Neck:     Comments: + L paracervical tenderness, no meningeal signs, nl ROM of the neck  Cardiovascular:     Pulses: Normal pulses.      Heart sounds: Normal heart sounds.  Pulmonary:     Effort: Pulmonary effort is normal.     Breath sounds: Normal breath sounds.  Abdominal:     General: Abdomen is flat.     Palpations: Abdomen is soft.  Musculoskeletal:        General: Normal range of motion.  Skin:    General: Skin is warm.     Capillary Refill: Capillary refill takes less than 2 seconds.  Neurological:     General: No focal deficit present.     Mental Status: She is alert and oriented to person, place, and time.     Comments: CN 2- 12 intact, ?  Decreased sensation over the bicep on the left side.  She has normal biceps flexion and tricep extension and wrist flexion and extension on the left arm.  She has normal reflexes throughout.  She has normal neuro exams in the right upper extremity as well as bilateral lower extremities.    Psychiatric:        Mood and Affect: Mood normal.     ED Results / Procedures / Treatments   Labs (all labs ordered are listed, but only abnormal results are displayed) Labs Reviewed  BASIC METABOLIC PANEL - Abnormal; Notable for the following components:      Result Value   Glucose, Bld 161 (*)    BUN 24 (*)    Creatinine, Ser 1.10 (*)    GFR calc non Af Amer 48 (*)    GFR calc Af Amer 56 (*)    All other components within normal limits  CBC - Abnormal; Notable for the following components:   WBC 14.3 (*)    All other components within normal limits  TROPONIN I (HIGH SENSITIVITY)  TROPONIN I (HIGH SENSITIVITY)    EKG EKG Interpretation  Date/Time:  Sunday May 31 2019 16:29:35 EST Ventricular Rate:  78 PR Interval:  134 QRS Duration: 128 QT Interval:  394 QTC Calculation: 449 R Axis:   71 Text Interpretation: Normal sinus rhythm Non-specific intra-ventricular conduction block Cannot rule out Anterior infarct , age undetermined Abnormal ECG When compared with ECG of 26-Nov-2018 07:28, PREVIOUS ECG IS PRESENT Confirmed by Wandra Arthurs [id number]) on 05/31/2019 8:01:14  PM   Radiology CT Angio Head W  or Wo Contrast  Result Date: 05/31/2019 CLINICAL DATA:  Left arm numbness. Assess for hemorrhage or dissection. Recent coronavirus vaccination. EXAM: CT ANGIOGRAPHY HEAD AND NECK TECHNIQUE: Multidetector CT imaging of the head and neck was performed using the standard protocol during bolus administration of intravenous contrast. Multiplanar CT image reconstructions and MIPs were obtained to evaluate the vascular anatomy. Carotid stenosis measurements (when applicable) are obtained utilizing NASCET criteria, using the distal internal carotid diameter as the denominator. CONTRAST:  94mL OMNIPAQUE IOHEXOL 350 MG/ML SOLN COMPARISON:  12/01/2013. FINDINGS: CT HEAD FINDINGS Brain: Mild age related volume loss. No evidence of old or acute focal infarction, mass lesion, hemorrhage, hydrocephalus or extra-axial collection. Vascular: No abnormal vascular finding. Skull: Negative Sinuses: Clear Orbits: Normal Review of the MIP images confirms the above findings CTA NECK FINDINGS Aortic arch: Minimal aortic atherosclerosis. No aneurysm or dissection. Branching pattern is normal without origin stenosis. Right carotid system: Common carotid artery widely patent to the bifurcation. Calcified and soft plaque at the carotid bifurcation and ICA bulb. Minimal diameter in the ICA bulb measures 2.5 mm. Compared to a more distal cervical ICA diameter of 4 mm, this indicates a 40% stenosis. Left carotid system: Common carotid artery widely patent to the bifurcation. Carotid bifurcation is normal without atherosclerotic disease or stenosis. Cervical ICA is normal. Vertebral arteries: Both vertebral artery origins are widely patent. Both vertebral arteries appear normal through the cervical region to the foramen magnum. Skeleton: Ordinary cervical spondylosis. Other neck: No mass or lymphadenopathy. Upper chest: Normal Review of the MIP images confirms the above findings CTA HEAD FINDINGS Anterior  circulation: Both internal carotid arteries are patent through the skull base and siphon regions. No siphon stenosis. The anterior and middle cerebral vessels are normal without proximal stenosis, aneurysm or vascular malformation. No large or medium vessel occlusion. Posterior circulation: Both vertebral arteries are widely patent to the basilar. No basilar stenosis. Posterior circulation branch vessels appear normal. Venous sinuses: Patent and normal. Anatomic variants: None significant. Review of the MIP images confirms the above findings IMPRESSION: Normal appearance of the brain for age. No acute or significant vascular pathology. No evidence vascular dissection. 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 No stenosis on the left. No posterior circulation pathology. Electronically Signed   By: Nelson Chimes M.D.   On: 05/31/2019 22:01   DG Chest 2 View  Result Date: 05/31/2019 CLINICAL DATA:  79 year old female with chest pain. EXAM: CHEST - 2 VIEW COMPARISON:  Chest radiograph dated 05/06/2014. FINDINGS: No focal consolidation, pleural effusion, pneumothorax. The cardiac silhouette is within normal limits. Degenerative changes of the spine. No definite acute osseous pathology. IMPRESSION: No acute cardiopulmonary process. Electronically Signed   By: Anner Crete M.D.   On: 05/31/2019 17:39   CT Angio Neck W and/or Wo Contrast  Result Date: 05/31/2019 CLINICAL DATA:  Left arm numbness. Assess for hemorrhage or dissection. Recent coronavirus vaccination. EXAM: CT ANGIOGRAPHY HEAD AND NECK TECHNIQUE: Multidetector CT imaging of the head and neck was performed using the standard protocol during bolus administration of intravenous contrast. Multiplanar CT image reconstructions and MIPs were obtained to evaluate the vascular anatomy. Carotid stenosis measurements (when applicable) are obtained utilizing NASCET criteria, using the  distal internal carotid diameter as the denominator. CONTRAST:  47mL OMNIPAQUE IOHEXOL 350 MG/ML SOLN COMPARISON:  12/01/2013. FINDINGS: CT HEAD FINDINGS Brain: Mild age related volume loss. No evidence of old or acute focal infarction, mass lesion,  hemorrhage, hydrocephalus or extra-axial collection. Vascular: No abnormal vascular finding. Skull: Negative Sinuses: Clear Orbits: Normal Review of the MIP images confirms the above findings CTA NECK FINDINGS Aortic arch: Minimal aortic atherosclerosis. No aneurysm or dissection. Branching pattern is normal without origin stenosis. Right carotid system: Common carotid artery widely patent to the bifurcation. Calcified and soft plaque at the carotid bifurcation and ICA bulb. Minimal diameter in the ICA bulb measures 2.5 mm. Compared to a more distal cervical ICA diameter of 4 mm, this indicates a 40% stenosis. Left carotid system: Common carotid artery widely patent to the bifurcation. Carotid bifurcation is normal without atherosclerotic disease or stenosis. Cervical ICA is normal. Vertebral arteries: Both vertebral artery origins are widely patent. Both vertebral arteries appear normal through the cervical region to the foramen magnum. Skeleton: Ordinary cervical spondylosis. Other neck: No mass or lymphadenopathy. Upper chest: Normal Review of the MIP images confirms the above findings CTA HEAD FINDINGS Anterior circulation: Both internal carotid arteries are patent through the skull base and siphon regions. No siphon stenosis. The anterior and middle cerebral vessels are normal without proximal stenosis, aneurysm or vascular malformation. No large or medium vessel occlusion. Posterior circulation: Both vertebral arteries are widely patent to the basilar. No basilar stenosis. Posterior circulation branch vessels appear normal. Venous sinuses: Patent and normal. Anatomic variants: None significant. Review of the MIP images confirms the above findings IMPRESSION: Normal  appearance of the brain for age. No acute or significant vascular pathology. No evidence vascular dissection. 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 No stenosis on the left. No posterior circulation pathology. Electronically Signed   By: Nelson Chimes M.D.   On: 05/31/2019 22:01    Procedures Procedures (including critical care time)  Medications Ordered in ED Medications  fentaNYL (SUBLIMAZE) injection 25 mcg (has no administration in time range)  iohexol (OMNIPAQUE) 350 MG/ML injection 75 mL (75 mLs Intravenous Contrast Given 05/31/19 2132)    ED Course  I have reviewed the triage vital signs and the nursing notes.  Pertinent labs & imaging results that were available during my care of the patient were reviewed by me and considered in my medical decision making (see chart for details).    MDM Rules/Calculators/A&P                      Kayla Mckee is a 79 y.o. female here presenting with left upper arm paresthesias. She had a Covid shot about 10 days ago and 2 to 3 days ago she started having some numbness in the left upper arm.  I do not see any signs of infection in the left deltoid area.  Could be a reaction to the Covid shot. Also consider radiculopathy versus hypertensive urgency versus dissection. Will get a CTA head and neck. Will get trop x 2 given chest pain. Will have her take her BP meds   10:49 PM Labs unremarkable. CTA showed carotid stenosis on the right. BP improved. Trop neg x 2. Likely radiculopathy. Will give tramadol for pain. Told her to monitor her BP at home.  She has follow-up with primary care doctor tomorrow.  Final Clinical Impression(s) / ED Diagnoses Final diagnoses:  None    Rx / DC Orders ED Discharge Orders    None       Drenda Freeze, MD 05/31/19 2250

## 2019-05-31 NOTE — ED Triage Notes (Signed)
Pt c/o left arm pain. Denies chest pain or SOB. Pt states that she had her first dose of vaccine on 2/19. Pain started Friday morning in bed with neck pain. Unable to lift her arm above her head.

## 2019-05-31 NOTE — ED Notes (Addendum)
Spoke with Dr. Darl Householder, re: pt's BP 225/80, ok for patient to take her home BP meds that she has on her.  Pt took Clonidine 0.1 mg and Metoprolol and Amlodipine 5mg .

## 2019-06-02 ENCOUNTER — Other Ambulatory Visit: Payer: Self-pay | Admitting: Sports Medicine

## 2019-06-02 DIAGNOSIS — M7542 Impingement syndrome of left shoulder: Secondary | ICD-10-CM

## 2019-06-02 DIAGNOSIS — M19019 Primary osteoarthritis, unspecified shoulder: Secondary | ICD-10-CM

## 2019-06-02 DIAGNOSIS — M542 Cervicalgia: Secondary | ICD-10-CM

## 2019-06-02 DIAGNOSIS — M25512 Pain in left shoulder: Secondary | ICD-10-CM

## 2019-06-11 ENCOUNTER — Ambulatory Visit
Admission: RE | Admit: 2019-06-11 | Discharge: 2019-06-11 | Disposition: A | Discharge status: Home / Self Care | Payer: Medicare Other | Source: Ambulatory Visit | Attending: Sports Medicine | Admitting: Sports Medicine

## 2019-06-11 ENCOUNTER — Other Ambulatory Visit: Payer: Self-pay

## 2019-06-11 DIAGNOSIS — M19019 Primary osteoarthritis, unspecified shoulder: Secondary | ICD-10-CM

## 2019-06-11 DIAGNOSIS — M542 Cervicalgia: Secondary | ICD-10-CM | POA: Insufficient documentation

## 2019-06-11 DIAGNOSIS — M25512 Pain in left shoulder: Secondary | ICD-10-CM

## 2019-06-11 DIAGNOSIS — M7542 Impingement syndrome of left shoulder: Secondary | ICD-10-CM | POA: Diagnosis present

## 2020-03-08 ENCOUNTER — Other Ambulatory Visit: Payer: Self-pay | Admitting: Internal Medicine

## 2020-03-08 ENCOUNTER — Ambulatory Visit: Payer: Medicare Other | Attending: Internal Medicine

## 2020-03-08 DIAGNOSIS — Z23 Encounter for immunization: Secondary | ICD-10-CM

## 2020-03-08 NOTE — Progress Notes (Signed)
   Covid-19 Vaccination Clinic  Name:  Kayla Mckee    MRN: [id number] DOB: 04-24-1940  03/08/2020  [name] was observed post Covid-19 immunization for 15 minutes without incident. She was provided with Vaccine Information Sheet and instruction to access the V-Safe system.   [name] was instructed to call 911 with any severe reactions post vaccine: Marland Kitchen Difficulty breathing  . Swelling of face and throat  . A fast heartbeat  . A bad rash all over body  . Dizziness and weakness   Immunizations Administered    Name Date Dose VIS Date Route   Pfizer COVID-19 Vaccine 03/08/2020  9:53 AM 0.3 mL 01/20/2020 Intramuscular   Manufacturer: [place]   Lot: [id number]   [place]: [id number]

## 2020-05-03 IMAGING — CT CT ANGIO NECK
1 of 9 series · 6 of 33 positions shown · IV contrast (APPLIED)
Comparison: 12/01/2013.

CLINICAL DATA: Left arm numbness. Assess for hemorrhage or
dissection. Recent coronavirus vaccination.

EXAM:
CT ANGIOGRAPHY HEAD AND NECK
TECHNIQUE: Multidetector CT imaging of the head and neck was performed using
the standard protocol during bolus administration of intravenous
contrast. Multiplanar CT image reconstructions and MIPs were
obtained to evaluate the vascular anatomy. Carotid stenosis
measurements (when applicable) are obtained utilizing NASCET
criteria, using the distal internal carotid diameter as the
denominator.
CONTRAST:  75mL OMNIPAQUE IOHEXOL 350 MG/ML SOLN

[Series 11: ax thin · axial · 0.32mm/px · z∈[-343,-96]mm · 6 of 358 slices shown]
[im 52/358  soft-tissue]
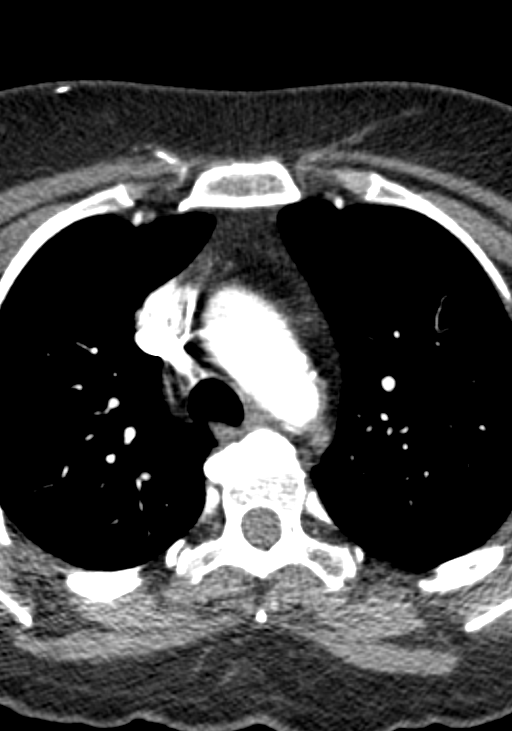
[im 103/358  bone]
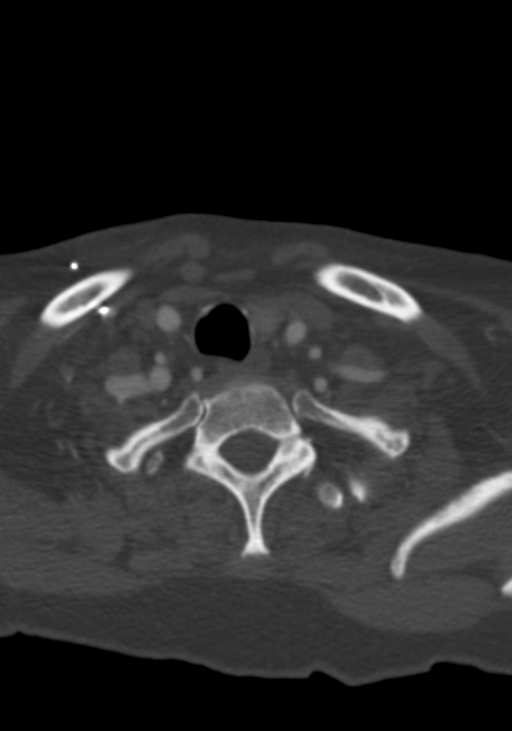
[im 154/358  soft-tissue]
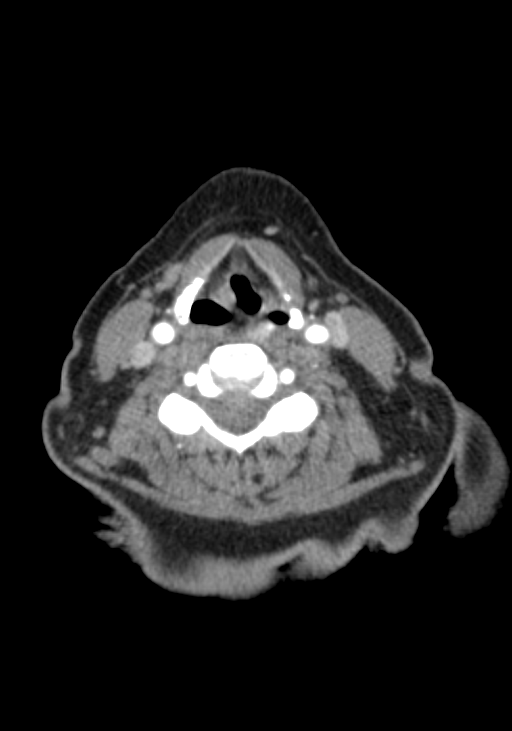
[im 205/358  bone]
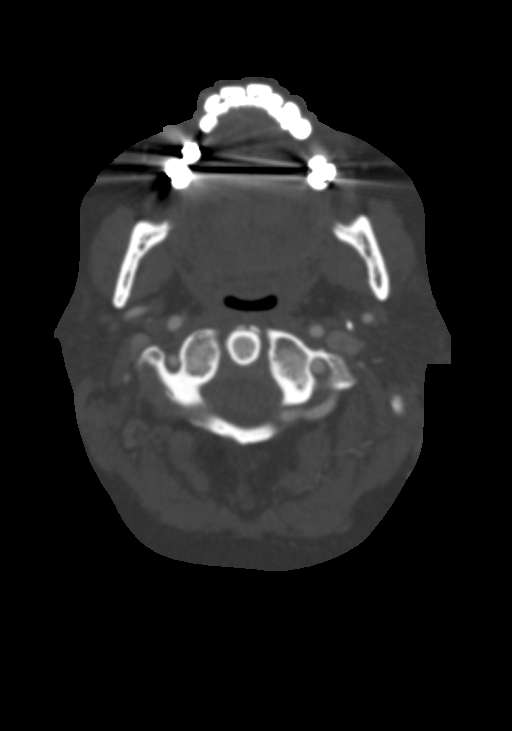
[im 256/358  soft-tissue]
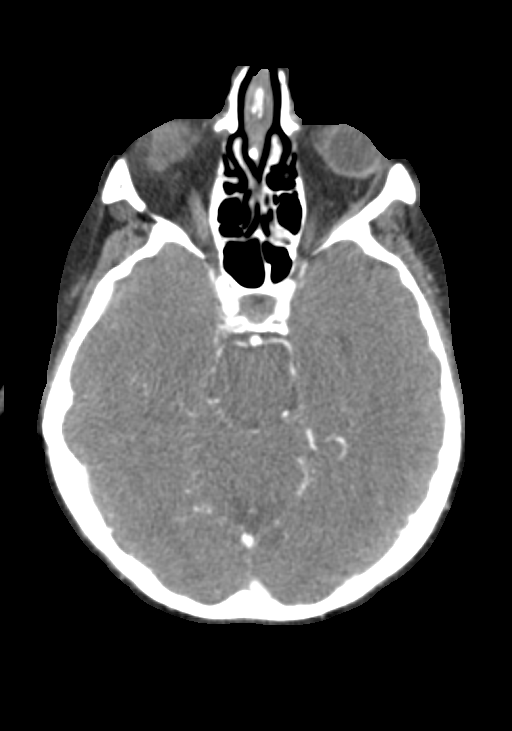
[im 307/358  bone]
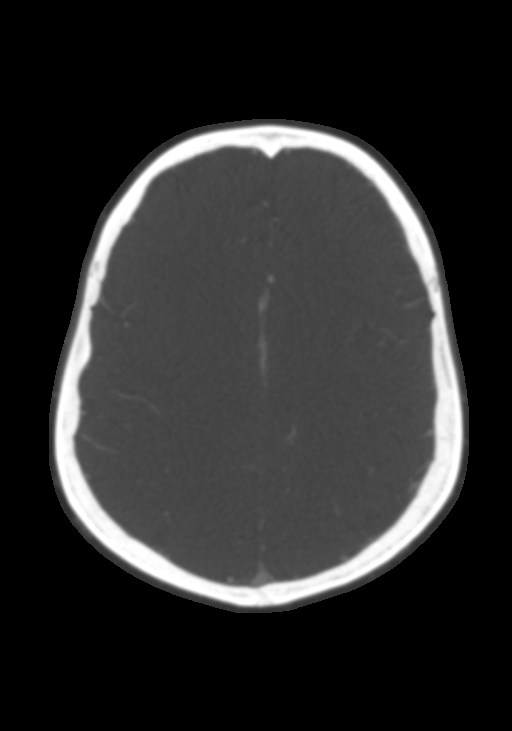

[6 of 33 positions shown; findings below may reference images not displayed]

FINDINGS: CT HEAD FINDINGS

Brain: Mild age related volume loss. No evidence of old or acute
focal infarction, mass lesion, hemorrhage, hydrocephalus or
extra-axial collection.

Vascular: No abnormal vascular finding.

Skull: Negative

Sinuses: Clear

Orbits: Normal

Review of the MIP images confirms the above findings

CTA NECK FINDINGS

Aortic arch: Minimal aortic atherosclerosis. No aneurysm or
dissection. Branching pattern is normal without origin stenosis.

Right carotid system: Common carotid artery widely patent to the
bifurcation. Calcified and soft plaque at the carotid bifurcation
and ICA bulb. Minimal diameter in the ICA bulb measures 2.5 mm.
Compared to a more distal cervical ICA diameter of 4 mm, this
indicates a 40% stenosis.

Left carotid system: Common carotid artery widely patent to the
bifurcation. Carotid bifurcation is normal without atherosclerotic
disease or stenosis. Cervical ICA is normal.

Vertebral arteries: Both vertebral artery origins are widely patent.
Both vertebral arteries appear normal through the cervical region to
the foramen magnum.

Skeleton: Ordinary cervical spondylosis.

Other neck: No mass or lymphadenopathy.

Upper chest: Normal

Review of the MIP images confirms the above findings

CTA HEAD FINDINGS

Anterior circulation: Both internal carotid arteries are patent
through the skull base and siphon regions. No siphon stenosis. The
anterior and middle cerebral vessels are normal without proximal
stenosis, aneurysm or vascular malformation. No large or medium
vessel occlusion.

Posterior circulation: Both vertebral arteries are widely patent to
the basilar. No basilar stenosis. Posterior circulation branch
vessels appear normal.

Venous sinuses: Patent and normal.

Anatomic variants: None significant.

Review of the MIP images confirms the above findings
IMPRESSION: Normal appearance of the brain for age.

No acute or significant vascular pathology. No evidence vascular
dissection.

Atherosclerotic disease at the right carotid bifurcation and ICA
bulb. Maximal stenosis in the ICA bulb measures 40%. No stenosis on
the left.

No posterior circulation pathology.

## 2020-11-02 ENCOUNTER — Other Ambulatory Visit: Payer: Self-pay

## 2020-11-04 ENCOUNTER — Other Ambulatory Visit: Payer: Self-pay

## 2020-11-04 ENCOUNTER — Ambulatory Visit: Payer: Medicare Other | Attending: Internal Medicine

## 2020-11-04 DIAGNOSIS — Z23 Encounter for immunization: Secondary | ICD-10-CM

## 2020-11-04 MED ORDER — PFIZER-BIONT COVID-19 VAC-TRIS 30 MCG/0.3ML IM SUSP
INTRAMUSCULAR | 0 refills | Status: DC
  Filled 2020-11-04: qty 0.3, 1d supply, fill #0

## 2020-11-04 NOTE — Progress Notes (Signed)
   Covid-19 Vaccination Clinic  Name:  Kayla Mckee    MRN: [id number] DOB: 1940-04-07  11/04/2020  [name] was observed post Covid-19 immunization for 15 minutes without incident. She was provided with Vaccine Information Sheet and instruction to access the V-Safe system.   [name] was instructed to call 911 with any severe reactions post vaccine: Difficulty breathing  Swelling of face and throat  A fast heartbeat  A bad rash all over body  Dizziness and weakness   Immunizations Administered     Name Date Dose VIS Date Route   PFIZER Comrnaty(Gray TOP) Covid-19 Vaccine 11/04/2020  2:29 PM 0.3 mL 03/10/2020 Intramuscular   Manufacturer: [place]   Lot: [id number]   [place]: Lambert, PharmD, MBA Clinical Acute Care Pharmacist

## 2021-12-18 ENCOUNTER — Ambulatory Visit: Payer: Medicare Other | Admitting: Urology

## 2021-12-18 VITALS — BP 166/67 | HR 50 | Ht 60.0 in | Wt 138.0 lb

## 2021-12-18 DIAGNOSIS — N3946 Mixed incontinence: Secondary | ICD-10-CM

## 2021-12-18 DIAGNOSIS — N3281 Overactive bladder: Secondary | ICD-10-CM

## 2021-12-18 MED ORDER — MIRABEGRON ER 50 MG PO TB24: 50.0000 mg | ORAL_TABLET | Freq: Every day | ORAL | 0 refills | Status: DC

## 2021-12-18 MED ORDER — MIRABEGRON ER 50 MG PO TB24: 50.0000 mg | ORAL_TABLET | Freq: Every day | ORAL | 11 refills | Status: DC

## 2021-12-18 NOTE — Progress Notes (Signed)
12/18/2021 3:01 PM   Kayla Mckee January 10, 1941 [id number]  Referring provider: Baxter Hire, MD [place],  [place] [address]  Chief Complaint  Patient presents with   Over Active Bladder    HPI: I was consulted to assess the patient's overactive bladder.  She has urge incontinence wearing 2 light liners a day.  No stress incontinence.  Mild bedwetting.  Flow was slow.  She will not necessarily feel empty but she does not double void.  Has had a hysterectomy.  She has been treated for 2 bladder infections in the last year  No history of kidney stones or bladder surgery.  She is a borderline diabetic.  No treatment.   PMH: Past Medical History:  Diagnosis Date   Cancer [facility])    skin    Surgical History: Past Surgical History:  Procedure Laterality Date   BREAST BIOPSY Right    negative 2003   LEFT HEART CATH AND CORONARY ANGIOGRAPHY Left 11/26/2018   Procedure: LEFT HEART CATH AND CORONARY ANGIOGRAPHY;  Surgeon: Isaias Cowman, MD;  Location: [facility];  Service: Cardiovascular;  Laterality: Left;   PARTIAL HYSTERECTOMY     with left oophorectomy    Home Medications:  Allergies as of 12/18/2021       Reactions   Codeine Rash        Medication List        Accurate as of December 18, 2021  3:01 PM. If you have any questions, ask your nurse or doctor.          acetaminophen 500 MG tablet Commonly known as: TYLENOL Take 500-1,000 mg by mouth every 6 (six) hours as needed (for pain.).   aluminum-magnesium hydroxide 200-200 MG/5ML suspension Take 15 mLs by mouth every 6 (six) hours as needed for indigestion.   amLODipine 5 MG tablet Commonly known as: NORVASC Take 5 mg by mouth daily.   cloNIDine 0.1 MG tablet Commonly known as: CATAPRES Take 0.1 mg by mouth 2 (two) times daily.   cyanocobalamin 1000 MCG tablet Commonly known as: VITAMIN B12 Take by mouth.   cyclobenzaprine 5 MG tablet Commonly known as:  FLEXERIL Take 1 tablet (5 mg total) by mouth 3 (three) times daily as needed for muscle spasms.   estradiol 2 MG tablet Commonly known as: ESTRACE Take 2 mg by mouth every evening.   metoprolol tartrate 50 MG tablet Commonly known as: LOPRESSOR Take 50 mg by mouth 2 (two) times daily.   omeprazole 40 MG capsule Commonly known as: PRILOSEC Take 40 mg by mouth daily.   Pfizer-BioNT COVID-19 Vac-TriS Susp injection Generic drug: COVID-19 mRNA Vac-TriS (Pfizer) Inject into the muscle.   pravastatin 40 MG tablet Commonly known as: PRAVACHOL Take 40 mg by mouth every evening.   triamterene-hydrochlorothiazide 37.5-25 MG tablet Commonly known as: MAXZIDE-25 Take 1 tablet by mouth daily.        Allergies:  Allergies  Allergen Reactions   Codeine Rash    Family History: Family History  Problem Relation Age of Onset   Heart attack Mother    Cancer - Prostate Father    Breast cancer Neg Hx     Social History:  reports that she has never smoked. She has never used smokeless tobacco. She reports that she does not drink alcohol and does not use drugs.  ROS:  Physical Exam: BP (!) 166/67   Pulse (!) 50   Ht 5' (1.524 m)   Wt 62.6 kg   BMI 26.95 kg/m   Constitutional:  Alert and oriented, No acute distress. HEENT: [place] AT, moist mucus membranes.  Trachea midline, no masses.  Laboratory Data: Lab Results  Component Value Date   WBC 14.3 (H) 05/31/2019   HGB 12.5 05/31/2019   HCT 38.5 05/31/2019   MCV 91.4 05/31/2019   PLT 396 05/31/2019    Lab Results  Component Value Date   CREATININE 1.10 (H) 05/31/2019    No results found for: "PSA"  No results found for: "TESTOSTERONE"  No results found for: "HGBA1C"  Urinalysis No results found for: "COLORURINE", "APPEARANCEUR", "LABSPEC", "PHURINE", "GLUCOSEU", "HGBUR", "BILIRUBINUR", "KETONESUR", "PROTEINUR", "UROBILINOGEN", "NITRITE",  "LEUKOCYTESUR"  Pertinent Imaging: Urine reviewed.  Urine sent for culture.  Chart reviewed.  Call if culture positive  Assessment & Plan: Patient has mild urge incontinence and bedwetting.  She has a feeling sometimes the double void.  He has mild frequency nocturia.  Return in 6 weeks on Myrbetriq 50 mg samples and prescription for pelvic examination cystoscopy  1. OAB (overactive bladder)  - Urinalysis, Complete   No follow-ups on file.  Reece Packer, MD  [facility] [address], [place], [place] [address] [phone]

## 2021-12-19 LAB — URINALYSIS, COMPLETE
Bilirubin, UA: NEGATIVE
Glucose, UA: NEGATIVE
Ketones, UA: NEGATIVE
Leukocytes,UA: NEGATIVE
Nitrite, UA: NEGATIVE
Protein,UA: NEGATIVE
RBC, UA: NEGATIVE
Specific Gravity, UA: 1.02 (ref 1.005–1.030)
Urobilinogen, Ur: 0.2 mg/dL (ref 0.2–1.0)
pH, UA: 5.5 (ref 5.0–7.5)

## 2021-12-19 LAB — MICROSCOPIC EXAMINATION

## 2021-12-20 LAB — CULTURE, URINE COMPREHENSIVE

## 2022-02-05 ENCOUNTER — Other Ambulatory Visit: Payer: Medicare Other | Admitting: Urology

## 2022-02-07 ENCOUNTER — Encounter: Payer: Self-pay | Admitting: Urology

## 2022-03-19 ENCOUNTER — Other Ambulatory Visit: Payer: Self-pay | Admitting: *Deleted

## 2022-03-19 MED ORDER — MIRABEGRON ER 50 MG PO TB24: 50.0000 mg | ORAL_TABLET | Freq: Every day | ORAL | 11 refills | Status: DC

## 2022-04-30 ENCOUNTER — Other Ambulatory Visit: Payer: Medicare Other | Admitting: Urology

## 2022-08-20 ENCOUNTER — Ambulatory Visit: Payer: Medicare Other | Admitting: Urology

## 2022-08-20 DIAGNOSIS — N3281 Overactive bladder: Secondary | ICD-10-CM

## 2022-08-20 MED ORDER — MIRABEGRON ER 50 MG PO TB24: 50.0000 mg | ORAL_TABLET | Freq: Every day | ORAL | 11 refills | Status: DC

## 2022-08-20 NOTE — Progress Notes (Unsigned)
08/20/2022 2:59 PM   Kayla Mckee 04/24/1940 [id number]  Referring provider: Gracelyn Nurse, MD [address] [place],  [place] [address]  Chief Complaint  Patient presents with   Cysto    HPI: I was consulted to assess the patient's overactive bladder.  She has urge incontinence wearing 2 light liners a day.  No stress incontinence.  Mild bedwetting.   Flow was slow.  She will not necessarily feel empty but she does not double void.   Has had a hysterectomy.  She has been treated for 2 bladder infections in the last year    Patient has mild urge incontinence and bedwetting. She has a feeling sometimes the double void. He has mild frequency nocturia. Return in 6 weeks on Myrbetriq 50 mg samples and prescription for pelvic examination cystoscopy   Today I have not seen the patient for 8 months.  Last culture negative.  Frequency stable.  Excellent continence.  No blood in urine.  Did not want cystoscopy and I agreed   PMH: Past Medical History:  Diagnosis Date   Cancer (HCC)    skin    Surgical History: Past Surgical History:  Procedure Laterality Date   BREAST BIOPSY Right    negative 2003   LEFT HEART CATH AND CORONARY ANGIOGRAPHY Left 11/26/2018   Procedure: LEFT HEART CATH AND CORONARY ANGIOGRAPHY;  Surgeon: Marcina Millard, MD;  Location: [facility] INVASIVE CV LAB;  Service: Cardiovascular;  Laterality: Left;   PARTIAL HYSTERECTOMY     with left oophorectomy    Home Medications:  Allergies as of 08/20/2022       Reactions   Codeine Rash        Medication List        Accurate as of Aug 20, 2022  2:59 PM. If you have any questions, ask your nurse or doctor.          acetaminophen 500 MG tablet Commonly known as: TYLENOL Take 500-1,000 mg by mouth every 6 (six) hours as needed (for pain.).   aluminum-magnesium hydroxide 200-200 MG/5ML suspension Take 15 mLs by mouth every 6 (six) hours as needed for indigestion.   amLODipine 5 MG  tablet Commonly known as: NORVASC Take 5 mg by mouth daily.   cloNIDine 0.1 MG tablet Commonly known as: CATAPRES Take 0.1 mg by mouth 2 (two) times daily.   cyanocobalamin 1000 MCG tablet Commonly known as: VITAMIN B12 Take by mouth.   cyclobenzaprine 5 MG tablet Commonly known as: FLEXERIL Take 1 tablet (5 mg total) by mouth 3 (three) times daily as needed for muscle spasms.   estradiol 2 MG tablet Commonly known as: ESTRACE Take 2 mg by mouth every evening.   metoprolol tartrate 50 MG tablet Commonly known as: LOPRESSOR Take 50 mg by mouth 2 (two) times daily.   mirabegron ER 50 MG Tb24 tablet Commonly known as: MYRBETRIQ Take 1 tablet (50 mg total) by mouth daily.   mirabegron ER 50 MG Tb24 tablet Commonly known as: MYRBETRIQ Take 1 tablet (50 mg total) by mouth daily.   omeprazole 40 MG capsule Commonly known as: PRILOSEC Take 40 mg by mouth daily.   Pfizer-BioNT COVID-19 Vac-TriS Susp injection Generic drug: COVID-19 mRNA Vac-TriS (Pfizer) Inject into the muscle.   pravastatin 40 MG tablet Commonly known as: PRAVACHOL Take 40 mg by mouth every evening.   triamterene-hydrochlorothiazide 37.5-25 MG tablet Commonly known as: MAXZIDE-25 Take 1 tablet by mouth daily.        Allergies:  Allergies  Allergen Reactions   Codeine Rash    Family History: Family History  Problem Relation Age of Onset   Heart attack Mother    Cancer - Prostate Father    Breast cancer Neg Hx     Social History:  reports that she has never smoked. She has never used smokeless tobacco. She reports that she does not drink alcohol and does not use drugs.  ROS:                                        Physical Exam: There were no vitals taken for this visit.  Constitutional:  Alert and oriented, No acute distress. HEENT: [place] AT, moist mucus membranes.  Trachea midline, no masses.   Laboratory Data: Lab Results  Component Value Date   WBC 14.3 (H)  05/31/2019   HGB 12.5 05/31/2019   HCT 38.5 05/31/2019   MCV 91.4 05/31/2019   PLT 396 05/31/2019    Lab Results  Component Value Date   CREATININE 1.10 (H) 05/31/2019    No results found for: "PSA"  No results found for: "TESTOSTERONE"  No results found for: "HGBA1C"  Urinalysis    Component Value Date/Time   APPEARANCEUR Clear 12/18/2021 1454   GLUCOSEU Negative 12/18/2021 1454   BILIRUBINUR Negative 12/18/2021 1454   PROTEINUR Negative 12/18/2021 1454   NITRITE Negative 12/18/2021 1454   LEUKOCYTESUR Negative 12/18/2021 1454    Pertinent Imaging:   Assessment & Plan: Prescription renewed and I will see in 1 year  1. OAB (overactive bladder)  - Urinalysis, Complete   No follow-ups on file.  Martina Sinner, MD  [facility] [address] [place], [place] [address] [phone]

## 2022-08-21 LAB — URINALYSIS, COMPLETE
Bilirubin, UA: NEGATIVE
Glucose, UA: NEGATIVE
Ketones, UA: NEGATIVE
Leukocytes,UA: NEGATIVE
Nitrite, UA: NEGATIVE
Protein,UA: NEGATIVE
RBC, UA: NEGATIVE
Specific Gravity, UA: 1.02 (ref 1.005–1.030)
Urobilinogen, Ur: 0.2 mg/dL (ref 0.2–1.0)
pH, UA: 5.5 (ref 5.0–7.5)

## 2022-08-21 LAB — MICROSCOPIC EXAMINATION: Epithelial Cells (non renal): >10 /hpf — AB (ref 0–10)

## 2023-06-02 ENCOUNTER — Emergency Department
Admission: EM | Admit: 2023-06-02 | Discharge: 2023-06-02 | Disposition: A | Discharge status: Home / Self Care | Attending: Emergency Medicine | Admitting: Emergency Medicine

## 2023-06-02 ENCOUNTER — Other Ambulatory Visit: Payer: Self-pay

## 2023-06-02 ENCOUNTER — Emergency Department

## 2023-06-02 DIAGNOSIS — J101 Influenza due to other identified influenza virus with other respiratory manifestations: Secondary | ICD-10-CM | POA: Insufficient documentation

## 2023-06-02 DIAGNOSIS — I10 Essential (primary) hypertension: Secondary | ICD-10-CM | POA: Insufficient documentation

## 2023-06-02 DIAGNOSIS — W19XXXA Unspecified fall, initial encounter: Secondary | ICD-10-CM | POA: Insufficient documentation

## 2023-06-02 DIAGNOSIS — R5383 Other fatigue: Secondary | ICD-10-CM | POA: Diagnosis present

## 2023-06-02 DIAGNOSIS — E119 Type 2 diabetes mellitus without complications: Secondary | ICD-10-CM | POA: Diagnosis not present

## 2023-06-02 LAB — RESP PANEL BY RT-PCR (RSV, FLU A&B, COVID)  RVPGX2
Influenza A by PCR: POSITIVE — AB
Influenza B by PCR: NEGATIVE
Resp Syncytial Virus by PCR: NEGATIVE
SARS Coronavirus 2 by RT PCR: NEGATIVE

## 2023-06-02 NOTE — ED Triage Notes (Signed)
 Pt sts that she fell at home and landed on her shoulder today. Pt sts that she can move her arm however it hurts to abduct it. Pt sts that she has also been congested for days now.

## 2023-06-02 NOTE — ED Provider Notes (Signed)
 [facility] Provider Note    Event Date/Time   First MD Initiated Contact with Patient 06/02/23 1605     (approximate)   History   Fall and Nasal Congestion   HPI  Kayla Mckee is a 83 y.o. female with a history of diabetes, hypertension who presents with fatigue weakness nasal congestion, body aches and also reports a fall 2 days ago.  She reports her legs gave out on her because she felt so weak.  Sick contact at work     Physical Exam   Triage Vital Signs: ED Triage Vitals  Encounter Vitals Group     BP 06/02/23 1542 (!) 177/74     Systolic BP Percentile --      Diastolic BP Percentile --      Pulse Rate 06/02/23 1542 60     Resp 06/02/23 1542 17     Temp 06/02/23 1542 98.2 F (36.8 C)     Temp Source 06/02/23 1542 Oral     SpO2 06/02/23 1542 98 %     Weight 06/02/23 1541 61.7 kg (136 lb)     Height 06/02/23 1541 1.524 m (5')     Head Circumference --      Peak Flow --      Pain Score 06/02/23 1541 0     Pain Loc --      Pain Education --      Exclude from Growth Chart --     Most recent vital signs: Vitals:   06/02/23 1542  BP: (!) 177/74  Pulse: 60  Resp: 17  Temp: 98.2 F (36.8 C)  SpO2: 98%     General: Awake, no distress.  CV:  Good peripheral perfusion.  Regular rate and rhythm Resp:  Normal effort.  Clear to auscultation bilaterally Abd:  No distention.  Soft, nontender Other:     ED Results / Procedures / Treatments   Labs (all labs ordered are listed, but only abnormal results are displayed) Labs Reviewed  RESP PANEL BY RT-PCR (RSV, FLU A&B, COVID)  [id number] - Abnormal; Notable for the following components:      Result Value   Influenza A by PCR POSITIVE (*)    All other components within normal limits     EKG     RADIOLOGY Chest x-ray viewed interpret by me, no pneumonia    PROCEDURES:  Critical Care performed:   Procedures   MEDICATIONS ORDERED IN ED: Medications - No data to  display   IMPRESSION / MDM / ASSESSMENT AND PLAN / ED COURSE  I reviewed the triage vital signs and the nursing notes. Patient's presentation is most consistent with acute illness / injury with system symptoms.  Patient presents with symptoms as noted above, suspicious for viral syndrome, possible influenza given prevalence in the community this time.  Reports mild discomfort to the left shoulder although has good range of motion, normal pulses distally, no bony abnormalities.  X-rays negative for fracture, chest x-ray without evidence of pneumonia, PCR is positive for influenza.  Discussed with patient need for symptomatic care, rest, outpatient follow-up PCP, return precautions discussed.        FINAL CLINICAL IMPRESSION(S) / ED DIAGNOSES   Final diagnoses:  Influenza A  Fall, initial encounter     Rx / DC Orders   ED Discharge Orders     None        Note:  This document was prepared using Dragon voice recognition software and may  include unintentional dictation errors.   Jene Every, MD 06/02/23 [phone]

## 2023-06-06 ENCOUNTER — Other Ambulatory Visit: Payer: Self-pay

## 2023-06-06 DIAGNOSIS — N3281 Overactive bladder: Secondary | ICD-10-CM

## 2023-06-06 MED ORDER — MIRABEGRON ER 50 MG PO TB24: 50.0000 mg | ORAL_TABLET | Freq: Every day | ORAL | 3 refills | Status: DC

## 2023-08-19 ENCOUNTER — Ambulatory Visit: Payer: Self-pay | Admitting: Urology

## 2023-10-07 ENCOUNTER — Telehealth: Payer: Self-pay | Admitting: Urology

## 2023-10-07 DIAGNOSIS — N3281 Overactive bladder: Secondary | ICD-10-CM

## 2023-10-07 MED ORDER — MIRABEGRON ER 50 MG PO TB24: 50.0000 mg | ORAL_TABLET | Freq: Every day | ORAL | 0 refills | Status: DC

## 2023-10-07 NOTE — Telephone Encounter (Signed)
 LVM informing pt I sent in one month refill to her pharmacy,

## 2023-10-07 NOTE — Telephone Encounter (Signed)
 Patient stopped in wanting to know if a RX refill for Mybetriq can be sent to pharmacy. Patient states she has only two tablets left and will not be seen until Monday 7/14. Please advise patient.

## 2023-10-14 ENCOUNTER — Ambulatory Visit: Admitting: Urology

## 2023-10-14 VITALS — BP 172/58 | HR 92 | Ht 60.0 in | Wt 132.0 lb

## 2023-10-14 DIAGNOSIS — N3281 Overactive bladder: Secondary | ICD-10-CM | POA: Diagnosis not present

## 2023-10-14 LAB — MICROSCOPIC EXAMINATION: Epithelial Cells (non renal): >10 /HPF — AB (ref 0–10)

## 2023-10-14 LAB — URINALYSIS, COMPLETE
Bilirubin, UA: NEGATIVE
Glucose, UA: NEGATIVE
Ketones, UA: NEGATIVE
Leukocytes,UA: NEGATIVE
Nitrite, UA: NEGATIVE
Protein,UA: NEGATIVE
RBC, UA: NEGATIVE
Specific Gravity, UA: 1.02 (ref 1.005–1.030)
Urobilinogen, Ur: 0.2 mg/dL (ref 0.2–1.0)
pH, UA: 6 (ref 5.0–7.5)

## 2023-10-14 MED ORDER — MIRABEGRON ER 50 MG PO TB24: 50.0000 mg | ORAL_TABLET | Freq: Every day | ORAL | 3 refills | Status: AC

## 2023-10-14 NOTE — Progress Notes (Signed)
 10/14/2023 2:37 PM   Nat PARAS Mckenna 1940/12/11 [id number]  Referring provider: Rudolpho Norleen BIRCH, MD [address] [facility] [place],  [place] [address]  Chief Complaint  Patient presents with   Follow-up    HPI: I was consulted to assess the patient's overactive bladder.  She has urge incontinence wearing 2 light liners a day.  No stress incontinence.  Mild bedwetting.   Flow was slow.  She will not necessarily feel empty but she does not double void.   Has had a hysterectomy.  She has been treated for 2 bladder infections in the last year     Patient has mild urge incontinence and bedwetting. She has a feeling sometimes the double void. He has mild frequency nocturia. Return in 6 weeks on Myrbetriq  50 mg samples and prescription for pelvic examination cystoscopy    Today I have not seen the patient for 8 months.  Last culture negative.  Frequency stable.  Excellent continence.  No blood in urine.  Did not want cystoscopy and I agreed  Today Excellent control.  Not leaking.  Frequency stable.  No infection       PMH: Past Medical History:  Diagnosis Date   Cancer (HCC)    skin    Surgical History: Past Surgical History:  Procedure Laterality Date   BREAST BIOPSY Right    negative 2003   LEFT HEART CATH AND CORONARY ANGIOGRAPHY Left 11/26/2018   Procedure: LEFT HEART CATH AND CORONARY ANGIOGRAPHY;  Surgeon: Ammon Blunt, MD;  Location: [facility] INVASIVE CV LAB;  Service: Cardiovascular;  Laterality: Left;   PARTIAL HYSTERECTOMY     with left oophorectomy    Home Medications:  Allergies as of 10/14/2023       Reactions   Codeine Rash        Medication List        Accurate as of October 14, 2023  2:37 PM. If you have any questions, ask your nurse or doctor.          amLODipine 5 MG tablet Commonly known as: NORVASC Take 5 mg by mouth daily.   cloNIDine 0.1 MG tablet Commonly known as: CATAPRES Take 0.1 mg by mouth 2 (two) times  daily.   cyanocobalamin 1000 MCG tablet Commonly known as: VITAMIN B12 Take by mouth.   estradiol 2 MG tablet Commonly known as: ESTRACE Take 2 mg by mouth every evening.   metoprolol tartrate 50 MG tablet Commonly known as: LOPRESSOR Take 50 mg by mouth 2 (two) times daily.   mirabegron  ER 50 MG Tb24 tablet Commonly known as: MYRBETRIQ  Take 1 tablet (50 mg total) by mouth daily.   omeprazole 40 MG capsule Commonly known as: PRILOSEC Take 40 mg by mouth daily.   pravastatin 40 MG tablet Commonly known as: PRAVACHOL Take 40 mg by mouth every evening.   triamterene-hydrochlorothiazide 37.5-25 MG tablet Commonly known as: MAXZIDE-25 Take 1 tablet by mouth daily.        Allergies:  Allergies  Allergen Reactions   Codeine Rash    Family History: Family History  Problem Relation Age of Onset   Heart attack Mother    Cancer - Prostate Father    Breast cancer Neg Hx     Social History:  reports that she has never smoked. She has never used smokeless tobacco. She reports that she does not drink alcohol and does not use drugs.  ROS:  Physical Exam: There were no vitals taken for this visit.  Constitutional:  Alert and oriented, No acute distress. HEENT: [place] AT, moist mucus membranes.  Trachea midline, no masses.   Laboratory Data: Lab Results  Component Value Date   WBC 14.3 (H) 05/31/2019   HGB 12.5 05/31/2019   HCT 38.5 05/31/2019   MCV 91.4 05/31/2019   PLT 396 05/31/2019    Lab Results  Component Value Date   CREATININE 1.10 (H) 05/31/2019    No results found for: PSA  No results found for: TESTOSTERONE  No results found for: HGBA1C  Urinalysis    Component Value Date/Time   APPEARANCEUR Clear 08/20/2022 1451   GLUCOSEU Negative 08/20/2022 1451   BILIRUBINUR Negative 08/20/2022 1451   PROTEINUR Negative 08/20/2022 1451   NITRITE Negative 08/20/2022 1451   LEUKOCYTESUR  Negative 08/20/2022 1451    Pertinent Imaging:   Assessment & Plan: Myrbetriq  50 mg daily 90 x 3 sent to pharmacy and I will see in 1 year  1. OAB (overactive bladder) (Primary)  - Urinalysis, Complete   No follow-ups on file.  Glendia DELENA Elizabeth, MD  [facility] [address] [place], [place] [address] [phone]

## 2023-10-16 LAB — CULTURE, URINE COMPREHENSIVE

## 2023-10-17 ENCOUNTER — Ambulatory Visit: Payer: Self-pay

## 2023-10-17 MED ORDER — CIPROFLOXACIN HCL 250 MG PO TABS: 250.0000 mg | ORAL_TABLET | Freq: Two times a day (BID) | ORAL | 0 refills | Status: AC

## 2023-11-25 ENCOUNTER — Other Ambulatory Visit: Payer: Self-pay | Admitting: Family Medicine

## 2023-11-25 DIAGNOSIS — M5416 Radiculopathy, lumbar region: Secondary | ICD-10-CM

## 2023-11-26 ENCOUNTER — Ambulatory Visit
Admission: RE | Admit: 2023-11-26 | Discharge: 2023-11-26 | Disposition: A | Discharge status: Home / Self Care | Source: Ambulatory Visit | Attending: Family Medicine | Admitting: Family Medicine

## 2023-11-26 DIAGNOSIS — M5416 Radiculopathy, lumbar region: Secondary | ICD-10-CM

## 2024-10-12 ENCOUNTER — Ambulatory Visit: Admitting: Urology
